# Patient Record
Sex: Female | Born: 1985 | Race: White | Hispanic: No | Marital: Married | Smoking: Never smoker
Health system: Southern US, Community
[De-identification: ages and names within clinical notes are randomized; demographics above are authoritative.]

## PROBLEM LIST (undated history)

## (undated) DIAGNOSIS — G43909 Migraine, unspecified, not intractable, without status migrainosus: Secondary | ICD-10-CM

## (undated) DIAGNOSIS — F32A Depression, unspecified: Secondary | ICD-10-CM

## (undated) DIAGNOSIS — J45909 Unspecified asthma, uncomplicated: Secondary | ICD-10-CM

## (undated) DIAGNOSIS — F419 Anxiety disorder, unspecified: Secondary | ICD-10-CM

## (undated) DIAGNOSIS — F329 Major depressive disorder, single episode, unspecified: Secondary | ICD-10-CM

## (undated) DIAGNOSIS — F1010 Alcohol abuse, uncomplicated: Secondary | ICD-10-CM

## (undated) HISTORY — DX: Alcohol abuse, uncomplicated: F10.10

## (undated) HISTORY — DX: Migraine, unspecified, not intractable, without status migrainosus: G43.909

## (undated) HISTORY — DX: Anxiety disorder, unspecified: F41.9

---
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
## 2005-06-30 ENCOUNTER — Other Ambulatory Visit: Admission: RE | Admit: 2005-06-30 | Discharge: 2005-06-30 | Payer: Self-pay | Admitting: Gynecology

## 2006-10-10 ENCOUNTER — Other Ambulatory Visit: Admission: RE | Admit: 2006-10-10 | Discharge: 2006-10-10 | Payer: Self-pay | Admitting: Gynecology

## 2008-03-13 ENCOUNTER — Other Ambulatory Visit: Admission: RE | Admit: 2008-03-13 | Discharge: 2008-03-13 | Payer: Self-pay | Admitting: Gynecology

## 2008-07-01 ENCOUNTER — Emergency Department (HOSPITAL_COMMUNITY): Admission: EM | Admit: 2008-07-01 | Discharge: 2008-07-01 | Payer: Self-pay | Admitting: Emergency Medicine

## 2009-03-26 ENCOUNTER — Encounter: Payer: Self-pay | Admitting: Women's Health

## 2009-03-26 ENCOUNTER — Other Ambulatory Visit: Admission: RE | Admit: 2009-03-26 | Discharge: 2009-03-26 | Payer: Self-pay | Admitting: Gynecology

## 2009-03-26 ENCOUNTER — Ambulatory Visit: Payer: Self-pay | Admitting: Women's Health

## 2010-06-02 ENCOUNTER — Ambulatory Visit: Payer: Self-pay | Admitting: Gynecology

## 2010-06-02 ENCOUNTER — Other Ambulatory Visit: Admission: RE | Admit: 2010-06-02 | Discharge: 2010-06-02 | Payer: Self-pay | Admitting: Gynecology

## 2010-10-19 LAB — TSH: TSH: 2.55 u[IU]/mL (ref <=5.90)

## 2011-06-06 ENCOUNTER — Encounter: Payer: Self-pay | Admitting: Women's Health

## 2011-06-08 ENCOUNTER — Ambulatory Visit (INDEPENDENT_AMBULATORY_CARE_PROVIDER_SITE_OTHER): Payer: PRIVATE HEALTH INSURANCE | Admitting: Women's Health

## 2011-06-08 ENCOUNTER — Other Ambulatory Visit (HOSPITAL_COMMUNITY)
Admission: RE | Admit: 2011-06-08 | Discharge: 2011-06-08 | Disposition: A | Discharge status: Home / Self Care | Payer: PRIVATE HEALTH INSURANCE | Source: Ambulatory Visit | Attending: Obstetrics and Gynecology | Admitting: Obstetrics and Gynecology

## 2011-06-08 ENCOUNTER — Encounter: Payer: Self-pay | Admitting: Women's Health

## 2011-06-08 VITALS — BP 110/60 | Ht 62.0 in | Wt 131.0 lb

## 2011-06-08 DIAGNOSIS — IMO0001 Reserved for inherently not codable concepts without codable children: Secondary | ICD-10-CM

## 2011-06-08 DIAGNOSIS — Z01419 Encounter for gynecological examination (general) (routine) without abnormal findings: Secondary | ICD-10-CM

## 2011-06-08 DIAGNOSIS — Z309 Encounter for contraceptive management, unspecified: Secondary | ICD-10-CM

## 2011-06-08 MED ORDER — NORGESTIMATE-ETH ESTRADIOL 0.25-35 MG-MCG PO TABS: 1.0000 | ORAL_TABLET | Freq: Every day | ORAL | Status: DC

## 2011-06-08 NOTE — Progress Notes (Signed)
[name] [name] 1986/03/31 [id number]    History:    The patient presents for annual exam.   Working at job she does not enjoy, ride horses which she does enjoy.   Past medical history, past surgical history, family history and social history were all reviewed and documented in the EPIC chart.   ROS:  A  ROS was performed and pertinent positives and negatives are included in the history.  Exam:  Filed Vitals:   06/08/11 1540  BP: 110/60    General appearance:  Normal Head/Neck:  Normal, without cervical or supraclavicular adenopathy. Thyroid:  Symmetrical, normal in size, without palpable masses or nodularity. Respiratory  Effort:  Normal  Auscultation:  Clear without wheezing or rhonchi Cardiovascular  Auscultation:  Regular rate, without rubs, murmurs or gallops  Edema/varicosities:  Not grossly evident Abdominal  Soft,nontender, without masses, guarding or rebound.  Liver/spleen:  No organomegaly noted  Hernia:  None appreciated  Skin  Inspection:  Grossly normal  Palpation:  Grossly normal Neurologic/psychiatric  Orientation:  Normal with appropriate conversation.  Mood/affect:  Normal  Genitourinary    Breasts: Examined lying and sitting.     Right: Without masses, retractions, discharge or axillary adenopathy.     Left: Without masses, retractions, discharge or axillary adenopathy.   Inguinal/mons:  Normal without inguinal adenopathy  External genitalia:  Normal  BUS/Urethra/Skene's glands:  Normal  Bladder:  Normal  Vagina:  Normal  Cervix:  Normal  Uterus:   normal in size, shape and contour.  Midline and mobile  Adnexa/parametria:     Rt: Without masses or tenderness.   Lt: Without masses or tenderness.  Anus and perineum: Normal  Digital rectal exam: Normal sphincter tone without palpated masses or tenderness  Assessment/Plan:  25 y.o. MWF G0 for annual exam. Monthly 5-6 day cycle on Ortho Tri-Cyclen. Has completed the Gardasil series. Has had some  problems with anxiety and depression but declines need for counseling or medication. She is rubella immune. States has had some decreased libido, but great relationship with her husband.  Normal GYN exam with decreased libido  Plan: Did review coming off birth control pills to see if that may help, pregnancy at this time. Will try a monophasic Sprintec, will start after completing her current pack. Did review importance of date nights, and making time for the relationship. SBEs, exercise, MVI daily, calcium rich diet encouraged. Prescription proper use for Sprintec was given with slight risk for blood clots, strokes. CBC, UA and Pap    Harrington Challenger [facility], 5:07 PM 06/08/2011

## 2011-06-13 LAB — POCT URINALYSIS DIP (DEVICE)
Ketones, ur: NEGATIVE
Nitrite: NEGATIVE
Protein, ur: 30 — AB
pH: 7

## 2011-06-13 LAB — POCT PREGNANCY, URINE: Preg Test, Ur: NEGATIVE

## 2011-06-22 ENCOUNTER — Other Ambulatory Visit: Payer: Self-pay | Admitting: Gynecology

## 2011-09-02 ENCOUNTER — Ambulatory Visit (INDEPENDENT_AMBULATORY_CARE_PROVIDER_SITE_OTHER): Payer: PRIVATE HEALTH INSURANCE

## 2011-09-02 DIAGNOSIS — F429 Obsessive-compulsive disorder, unspecified: Secondary | ICD-10-CM

## 2011-10-04 ENCOUNTER — Telehealth: Payer: Self-pay | Admitting: *Deleted

## 2011-10-04 NOTE — Telephone Encounter (Signed)
Pt called and said she is ready to start her family. She is married and wants to know if you would recommend any prenatal vitamins to take? Please advise

## 2011-10-05 NOTE — Telephone Encounter (Signed)
Any over-the-counter prenatal vitamin that has DHA. DHA is supposedly helpful for brain development and therefore smart children. Review no alcohol, avoid over-the-counter medications other than plain Tylenol and Robitussin, if questions about other medications please call. If she has a CAT best to have husband change litter box. Also best to avoid unpasteurized cheeses and tuna with pregnancy. Have her call the office when she misses a cycle to schedule a viability ultrasound. We will give her copy of the ultrasound to take to her first prenatal visit. Review we do not deliver babies but will refer.  Have her call if she has any questions.

## 2011-10-05 NOTE — Telephone Encounter (Signed)
Pt informed with all the below note. She will follow up if questions.

## 2011-10-05 NOTE — Telephone Encounter (Signed)
L/M FOR PT TO CALL.

## 2011-10-17 ENCOUNTER — Encounter: Payer: Self-pay | Admitting: Family Medicine

## 2011-10-19 ENCOUNTER — Ambulatory Visit (INDEPENDENT_AMBULATORY_CARE_PROVIDER_SITE_OTHER): Payer: PRIVATE HEALTH INSURANCE | Admitting: Internal Medicine

## 2011-10-19 ENCOUNTER — Encounter: Payer: Self-pay | Admitting: Internal Medicine

## 2011-10-19 DIAGNOSIS — F411 Generalized anxiety disorder: Secondary | ICD-10-CM | POA: Insufficient documentation

## 2011-10-19 DIAGNOSIS — F429 Obsessive-compulsive disorder, unspecified: Secondary | ICD-10-CM

## 2011-10-19 DIAGNOSIS — F419 Anxiety disorder, unspecified: Secondary | ICD-10-CM

## 2011-10-19 NOTE — Progress Notes (Signed)
  Subjective:    Patient ID: [name] [name], female    DOB: Jan 10, 1986, 26 y.o.   MRN: [id number]  [facility] presents as a followup for her OCD and anxiety. She has had a remarkable two-month period where she has decided to become pregnant. She is 26 years old and is very stable relationship and her husband and is excited about the idea. She has weaned herself off all of her medications and stopped her birth control pill and is taking a prenatal vitamin. Her OB care will be arranged by Maryelizabeth Rowan. She has completely stopped alcohol and as a result her anxiety is much better. She still struggles with excessive thoughts about harm to her parents and other Loved ones but is using breathing techniques and rational thinking to control those for now    Review of Systemsnoncontributory Family history is important in that the mother had significant postpartum depression     Objective:   Physical Examvital signs were stable and no other part of the exam was needed        Assessment & Plan:  Problem #1 OCD  Problem #2 anxiety  She will begin an exercise program and continue the prenatal vitamins. She will get the book OCD for dummies and begin to look at cognitive behavioral therapy in the event that her symptoms become worse. She will followup when necessary but certainly by the time of delivery so that we can plan for the postpartum period

## 2011-11-14 ENCOUNTER — Encounter: Payer: Self-pay | Admitting: Women's Health

## 2011-11-14 ENCOUNTER — Ambulatory Visit (INDEPENDENT_AMBULATORY_CARE_PROVIDER_SITE_OTHER): Payer: PRIVATE HEALTH INSURANCE | Admitting: Women's Health

## 2011-11-14 DIAGNOSIS — N912 Amenorrhea, unspecified: Secondary | ICD-10-CM

## 2011-11-14 LAB — PREGNANCY, URINE: Preg Test, Ur: POSITIVE

## 2011-11-14 NOTE — Patient Instructions (Signed)
Guidelines for Vaccination and Immunization during Pregnancy It is important to be immunized against common and ordinary contagious diseases when you are pregnant. Being immunized will protect yourself and your unborn baby from becoming very ill from these infections. When possible immunizations should be given before getting pregnant. This is especially true if you live in a high risk area for contracting any of the diseases that have a vaccine.  Vaccines produce antibodies that protect you from getting the infection. Risk to a developing fetus from vaccination of the mother during pregnancy is mostly theoretical. No evidence exists of risk from vaccinating pregnant women with inactivated viral or bacterial vaccines or toxoids. Benefits of vaccinating pregnant women usually outweigh risks when:  The likelihood of disease exposure is high.   Infection would pose a risk to the mother or fetus.   The vaccine is unlikely to cause harm.  Live-virus and live bacterial vaccines are not usually given to pregnant women. There is a possible risk of passing the vaccine virus or bacteria to the fetus. If a live-virus or live-bacterial vaccine is accidentally given to a pregnant woman, or if a woman becomes pregnant within 4 weeks after vaccination, she should be counseled about the potential effects on the fetus. It is not usually an indication to end the pregnancy. Whether live or inactivated (killed virus or bacteria) vaccines are used, vaccination of pregnant women should be considered on the basis of risks versus benefits.  ROUTINE VACCINES HEPATITIS A  The safety of hepatitis A vaccination during pregnancy has not been determined. Because hepatitis A vaccine is produced from inactivated hepatitis A virus, the theoretical risk to the fetus is expected to be low. The risk associated with vaccination should be weighed against the risk for hepatitis A in women who may be at high risk for exposure to hepatitis A  virus. There is no known risk from the vaccine affecting the fetus. HEPATITIS B On the basis of limited data, there is no apparent risk of adverse effects to the fetus when hepatitis B vaccine is given to pregnant women. The vaccine contains noninfectious HBsAg particles and should cause no risk to the fetus. Hepatitis B virus infection affecting a pregnant woman may result in:  Severe disease for the mother.   Abortion or chronic infection for the newborn.  Therefore, neither pregnancy nor breastfeeding should stop this vaccination of women. Hepatitis B vaccine is recommended for pregnant women at risk for hepatitis B virus infection. If the mother contracts Hepatitis B during pregnancy, her newborn baby should receive the vaccine and hepatitis B immune globulin.  INFLUENZA (inactivated)  All women who will be pregnant during the influenza season should be vaccinated because of the increased risk for influenza-related complications. Vaccination can occur in any trimester. Pregnant women should only receive inactivated influenza vaccine. Pregnant women who develop influenza and are not vaccinated are at risk for:  Severe disease.   Spontaneous abortion.  MEASLES Measles-mumps-rubella (MMR) vaccine and its component (partial) vaccines should NOT be given to women known to be pregnant. There may be a risk to the fetus from these live virus vaccines. Women should avoid becoming pregnant for 28 days after vaccination with measles or mumps vaccines or MMR or other rubella-containing vaccines. If a pregnant woman is accidentally vaccinated or if she becomes pregnant within 4 weeks after an MMR vaccination, she should be counseled regarding the risk to the fetus. However, the MMR vaccine given during pregnancy should not be a reason to end  the pregnancy.  MUMPS Measles-mumps rubella (MMR) vaccine and its component (partial) vaccines should NOT be given to women known to be pregnant. There may be a risk to  the fetus from injection of these live virus vaccines. Women should be counseled to avoid becoming pregnant for 28 days after vaccination with measles or mumps vaccines or MMR or other rubella-containing vaccines. If a pregnant woman is accidentally vaccinated or if she becomes pregnant within 4 weeks after MMR vaccination, she should be counseled regarding the risk to the fetus. However, MMR vaccination during pregnancy should not ordinarily be a reason to end the pregnancy.  PNEUMOCOCCAL  The safety of pneumococcal polysaccharide vaccine during the first trimester of pregnancy has not been studied. No problems have been reported among newborns whose mothers were vaccinated during pregnancy. POLIO (IPV)  Although no adverse effects of IPV have been documented among pregnant women or their fetuses, vaccination of pregnant women should be avoided to be on the safe side. However, if a pregnant woman is at increased risk for infection and requires immediate protection against polio, IPV can be given according to the recommended schedules for adults.  RUBELLA  Measles-mumps rubella (MMR) vaccine and its component (partial) vaccines should NOT be given to women known to be pregnant. There may be a risk to the fetus from injection of these live virus vaccines. Women should be counseled to avoid becoming pregnant for 28 days after vaccination with:  Measles or mumps vaccine.   MMR.   Other rubella-containing vaccines.  If a pregnant woman is accidentally vaccinated or if she becomes pregnant within 4 weeks after MMR vaccination, she should be counseled regarding the risk to the fetus. However, MMR vaccination during pregnancy should not ordinarily be a reason to terminate the pregnancy. Women who have not had rubella and are not vaccinated because they are or may be pregnant should be counseled about the potential risk for CRS (congenital rubella syndrome). They should also be informed about the importance of  being vaccinated as soon as they are no longer pregnant. TETANUS & DIPHTHERIA  Td toxoid is commonly indicated for pregnant women. Previously vaccinated pregnant women who have not received a Td vaccination within the last 10 years should receive a booster dose. Pregnant women who are not immunized or only partially immunized against tetanus should complete the primary series. Although no evidence exists that tetanus and diphtheria toxoids are harmful to the fetus or embryo, waiting until the second trimester of pregnancy to give Td is a reasonable warning for limiting any concern about the possibility of such reactions.  VARICELLA The effects of the varicella virus vaccine on the fetus are unknown. Pregnant women should NOT be vaccinated with this live virus vaccine. Women who are vaccinated should also avoid becoming pregnant for 1 month following each injection. For people who are susceptible to the virus, having a pregnant household member is not a reason to avoid vaccination. If a pregnant woman is vaccinated by mistake or if she becomes pregnant within 4 weeks after varicella vaccination, she should be counseled regarding the risk to the fetus. However, varicella vaccination during pregnancy should not be a reason to end the pregnancy. VZIG [Varicella Zoster Immune Globulin] should be strongly considered for susceptible pregnant women who have been exposed to the virus. The manufacturer & CDC have established a VARIVAX Pregnancy Registry to monitor outcomes of women who got the vaccine 3 months before or any time during pregnancy.  TRAVEL AND OTHER VACCINES ANTHRAX  No studies have been done about the use of anthrax vaccine among pregnant women. Pregnant women should be vaccinated against anthrax ONLY IF the potential benefits outweigh the potential risks to the fetus.  BCG  BCG is a vaccine that contains live bacteria. Although no harmful effects to the fetus have been associated with BCG vaccine,  its use is NOT recommended during pregnancy. JAPANESE ENCEPHALITIS  This vaccine contains live virus. No specific information is available on the safety of JE vaccine in pregnancy. This vaccination poses an unknown but possible risk to the developing fetus. The vaccine should NOT be routinely given during pregnancy. Pregnant women who must travel to an area where risk of JE is high should be vaccinated when the possible risks of immunization are outweighed by the risk of infection to the mother and developing fetus.  MENINGOCOCCAL  Studies have shown this vaccine to be both safe and effective when given to pregnant women. There are no reports of the vaccine adversely affecting the fetus. If indicated because of travel, vaccination should be offered to pregnant women.  RABIES  Because of the possible danger of untreated exposure to rabies, and because there is no indication that problems with the fetus have been associated with the rabies vaccine (killed virus), it is not necessary for pregnant women to avoid the vaccine if exposed to rabies. If the risk of being exposed to rabies is great, pre-exposure treatment might also be suggested. There is no indication that fetal abnormalities have been associated with rabies vaccination. Pregnancy is not a reason to avoid vaccination against rabies. If the risk of exposure to rabies is substantial, preexposure prophylaxis might also be indicated during pregnancy. TYPHOID There is no data on the use of any of the three typhoid vaccines among pregnant women. It is not recommended except in cases of very high risk of of exposure to typhoid. There are no reports of the vaccine adversely affecting the fetus. VACCINIA (SMALLPOX) This is a live virus vaccine and is NOT recommended during pregnancy. Vaccinia vaccine should NOT be given to pregnant women for non-emergency reasons. However, vaccinia vaccine is not known to cause congenital problems. Although less than 50  cases of fetal vaccinia infection have been reported, vaccinia virus has been reported to cause fetal infection on rare occasions, almost always after the primary vaccination of the mother. Pregnant women who have had a definite exposure to smallpox virus are at high risk for contracting the disease and should be vaccinated. Definite exposure means the pregnant women has had:  Face-to-face.   Household.   Close-proximity contact with a smallpox patient.  Smallpox infection among pregnant women has been reported to result in a more severe infection than among non-pregnant women. The risks to the mother and the fetus from experiencing smallpox outweigh any potential risks from the vaccination. In addition, vaccinia virus has not been proven to cause developmental abnormalities to the fetus or embryo, and the incidence of fetal vaccinia is low. When the level of exposure risk is unknown, the decision to vaccinate should be made after evaluation by the caregiver and discussing with the patient the potential risks versus the benefits of smallpox vaccination. YELLOW FEVER The safety of yellow fever vaccination during pregnancy has not been proven. The vaccine (live virus) should be given only if:  Travel to an area known to be infected is unavoidable   An increased risk for exposure exists.  If international travel are the only reason to vaccinate a pregnant woman,  rather than an increased risk of infection, efforts should be made to obtain a waiver letter from the traveling woman's caregiver. Pregnant women who must travel to areas where the risk of yellow fever is high should be vaccinated. Despite the apparent safety of this vaccine, infants born to these women should be monitored closely for:  Evidence of congenital (existing at birth) infection.   Other possible harmful effects resulting from yellow fever vaccination.  If vaccination of a pregnant woman is necessary, blood tests to document an  immune response to the vaccine can be considered. To discuss the need for blood testing contact:  The appropriate state health department.   The Division of Vector-Borne Infectious Diseases.   The Division of Global Migration and Quarantine at [employer].  If you are pregnant or thinking of becoming pregnant, check with your caregivers regarding current guidelines and recommendations. To be safe, see your caregiver before getting pregnant and get the necessary immunizations.  Most of this information courtesy of the CDC.  Document Released: 09/18/2007 Document Revised: 08/18/2011 Document Reviewed: 05/28/2009 [facility] Patient Information 2012 [place], [place].

## 2011-11-14 NOTE — Progress Notes (Signed)
Patient ID: [name] [name], female   DOB: [birth year], 26 y.o.   MRN: [id number] Presents with a home positive U PT. LMP 10/12/11. Stopped birth control pills in  December. Currently taking only a prenatal vitamin daily. Denies any bleeding, cramping, or change in discharge. Pleased with pregnancy.  U PT positive, 4 weeks 4 days for dates.  Plan: Viability ultrasound after March 18, we'll schedule. Is aware we no longer deliver. We'll seek prenatal care after viability ultrasound. Continue prenatal vitamin daily, reviewed safe behaviors in pregnancy. She is a horse back rider, did caution against riding.

## 2011-11-26 ENCOUNTER — Other Ambulatory Visit: Payer: Self-pay | Admitting: Internal Medicine

## 2011-11-28 ENCOUNTER — Encounter: Payer: Self-pay | Admitting: Women's Health

## 2011-11-28 ENCOUNTER — Other Ambulatory Visit: Payer: Self-pay | Admitting: Gynecology

## 2011-11-28 ENCOUNTER — Ambulatory Visit (INDEPENDENT_AMBULATORY_CARE_PROVIDER_SITE_OTHER): Payer: PRIVATE HEALTH INSURANCE

## 2011-11-28 ENCOUNTER — Ambulatory Visit (INDEPENDENT_AMBULATORY_CARE_PROVIDER_SITE_OTHER): Payer: PRIVATE HEALTH INSURANCE | Admitting: Women's Health

## 2011-11-28 DIAGNOSIS — N912 Amenorrhea, unspecified: Secondary | ICD-10-CM

## 2011-11-28 DIAGNOSIS — O3680X0 Pregnancy with inconclusive fetal viability, not applicable or unspecified: Secondary | ICD-10-CM

## 2011-11-28 LAB — US OB TRANSVAGINAL

## 2011-11-28 NOTE — Progress Notes (Signed)
Patient ID: NYCHELLE CASSATA, female   DOB: [birth year], 26 y.o.   MRN: [id number] Presents for a viability ultrasound. Denies any bleeding or cramping or problems.  Ultrasound: Anteverted uterus with living IUP seen in fundus. Size equal dates  by LMP. Fetal pole and fetal heart motion seen. Right ovary corpus luteum cyst. Left ovary normal. Negative cul-de-sac. Cervix long and closed.  Plan: A copy of ultrasound was given to take to first prenatal office visit. Continue prenatal vitamins daily. Is aware of safe pregnancy behaviors. Congratulations given.

## 2011-12-08 LAB — OB RESULTS CONSOLE GC/CHLAMYDIA
Chlamydia: NEGATIVE
Gonorrhea: NEGATIVE

## 2011-12-08 LAB — OB RESULTS CONSOLE ANTIBODY SCREEN: Antibody Screen: NEGATIVE

## 2011-12-08 LAB — OB RESULTS CONSOLE ABO/RH: RH Type: POSITIVE

## 2011-12-08 LAB — OB RESULTS CONSOLE RUBELLA ANTIBODY, IGM: Rubella: IMMUNE

## 2012-07-23 ENCOUNTER — Inpatient Hospital Stay (HOSPITAL_COMMUNITY)
Admission: AD | Admit: 2012-07-23 | Discharge: 2012-07-27 | DRG: 371 | Disposition: A | Discharge status: Home / Self Care | Payer: BC Managed Care – PPO | Source: Ambulatory Visit | Attending: Obstetrics and Gynecology | Admitting: Obstetrics and Gynecology

## 2012-07-23 ENCOUNTER — Encounter (HOSPITAL_COMMUNITY): Payer: Self-pay | Admitting: *Deleted

## 2012-07-23 DIAGNOSIS — O324XX0 Maternal care for high head at term, not applicable or unspecified: Secondary | ICD-10-CM | POA: Diagnosis present

## 2012-07-23 DIAGNOSIS — F419 Anxiety disorder, unspecified: Secondary | ICD-10-CM

## 2012-07-23 DIAGNOSIS — F429 Obsessive-compulsive disorder, unspecified: Secondary | ICD-10-CM

## 2012-07-23 HISTORY — DX: Major depressive disorder, single episode, unspecified: F32.9

## 2012-07-23 HISTORY — DX: Unspecified asthma, uncomplicated: J45.909

## 2012-07-23 HISTORY — DX: Depression, unspecified: F32.A

## 2012-07-23 LAB — CBC
Platelets: 170 10*3/uL (ref 150–400)
RBC: 4.14 MIL/uL (ref 3.87–5.11)
RDW: 13.4 % (ref 11.5–15.5)
WBC: 13.4 10*3/uL — ABNORMAL HIGH (ref 4.0–10.5)

## 2012-07-23 MED ORDER — ZOLPIDEM TARTRATE 5 MG PO TABS
5.0000 mg | ORAL_TABLET | Freq: Every evening | ORAL | Status: DC | PRN
  Administered 2012-07-23: 5 mg via ORAL
  Filled 2012-07-23: qty 1

## 2012-07-23 MED ORDER — LACTATED RINGERS IV SOLN: 500.0000 mL | INTRAVENOUS | Status: DC | PRN

## 2012-07-23 MED ORDER — OXYTOCIN 40 UNITS IN LACTATED RINGERS INFUSION - SIMPLE MED: 62.5000 mL/h | INTRAVENOUS | Status: DC

## 2012-07-23 MED ORDER — MISOPROSTOL 25 MCG QUARTER TABLET: 25.0000 ug | ORAL_TABLET | ORAL | Status: DC | PRN

## 2012-07-23 MED ORDER — TERBUTALINE SULFATE 1 MG/ML IJ SOLN: 0.2500 mg | Freq: Once | INTRAMUSCULAR | Status: AC | PRN

## 2012-07-23 MED ORDER — ONDANSETRON HCL 4 MG/2ML IJ SOLN
4.0000 mg | Freq: Four times a day (QID) | INTRAMUSCULAR | Status: DC | PRN
  Administered 2012-07-24: 4 mg via INTRAVENOUS
  Filled 2012-07-23: qty 2

## 2012-07-23 MED ORDER — ACETAMINOPHEN 325 MG PO TABS: 650.0000 mg | ORAL_TABLET | ORAL | Status: DC | PRN

## 2012-07-23 MED ORDER — IBUPROFEN 600 MG PO TABS: 600.0000 mg | ORAL_TABLET | Freq: Four times a day (QID) | ORAL | Status: DC | PRN

## 2012-07-23 MED ORDER — LIDOCAINE HCL (PF) 1 % IJ SOLN
30.0000 mL | INTRAMUSCULAR | Status: DC | PRN
  Filled 2012-07-23: qty 30

## 2012-07-23 MED ORDER — CITRIC ACID-SODIUM CITRATE 334-500 MG/5ML PO SOLN
30.0000 mL | ORAL | Status: DC | PRN
  Administered 2012-07-24: 30 mL via ORAL
  Filled 2012-07-23: qty 15

## 2012-07-23 MED ORDER — OXYTOCIN 40 UNITS IN LACTATED RINGERS INFUSION - SIMPLE MED
1.0000 m[IU]/min | INTRAVENOUS | Status: DC
  Administered 2012-07-23: 2 m[IU]/min via INTRAVENOUS
  Filled 2012-07-23: qty 1000

## 2012-07-23 MED ORDER — OXYTOCIN BOLUS FROM INFUSION: 500.0000 mL | INTRAVENOUS | Status: DC

## 2012-07-23 MED ORDER — LACTATED RINGERS IV SOLN
INTRAVENOUS | Status: DC
  Administered 2012-07-24 (×2): via INTRAVENOUS

## 2012-07-23 MED ORDER — OXYCODONE-ACETAMINOPHEN 5-325 MG PO TABS: 1.0000 | ORAL_TABLET | ORAL | Status: DC | PRN

## 2012-07-23 MED ORDER — OXYTOCIN 40 UNITS IN LACTATED RINGERS INFUSION - SIMPLE MED: 1.0000 m[IU]/min | INTRAVENOUS | Status: DC

## 2012-07-23 NOTE — Progress Notes (Signed)
Notified Dr. Renaldo Fiddler concerning uterine activity and placement of Cytotec. Orders received to begin Pitocin infusion per protocol

## 2012-07-24 ENCOUNTER — Encounter (HOSPITAL_COMMUNITY): Payer: Self-pay | Admitting: *Deleted

## 2012-07-24 ENCOUNTER — Encounter (HOSPITAL_COMMUNITY): Payer: Self-pay | Admitting: Anesthesiology

## 2012-07-24 LAB — COMPREHENSIVE METABOLIC PANEL
ALT: 11 U/L (ref 0–35)
Albumin: 2.4 g/dL — ABNORMAL LOW (ref 3.5–5.2)
Alkaline Phosphatase: 142 U/L — ABNORMAL HIGH (ref 39–117)
BUN: 8 mg/dL (ref 6–23)
Chloride: 102 mEq/L (ref 96–112)
Potassium: 3.9 mEq/L (ref 3.5–5.1)
Sodium: 133 mEq/L — ABNORMAL LOW (ref 135–145)
Total Bilirubin: 0.4 mg/dL (ref 0.3–1.2)
Total Protein: 5.7 g/dL — ABNORMAL LOW (ref 6.0–8.3)

## 2012-07-24 LAB — URIC ACID: Uric Acid, Serum: 6.2 mg/dL (ref 2.4–7.0)

## 2012-07-24 LAB — CBC
HCT: 35.5 % — ABNORMAL LOW (ref 36.0–46.0)
Hemoglobin: 12 g/dL (ref 12.0–15.0)
RDW: 13.3 % (ref 11.5–15.5)
WBC: 14.2 10*3/uL — ABNORMAL HIGH (ref 4.0–10.5)

## 2012-07-24 LAB — RPR: RPR Ser Ql: NONREACTIVE

## 2012-07-24 MED ORDER — EPHEDRINE 5 MG/ML INJ: 10.0000 mg | INTRAVENOUS | Status: DC | PRN

## 2012-07-24 MED ORDER — OXYTOCIN 40 UNITS IN LACTATED RINGERS INFUSION - SIMPLE MED: 1.0000 m[IU]/min | INTRAVENOUS | Status: DC

## 2012-07-24 MED ORDER — FENTANYL 2.5 MCG/ML BUPIVACAINE 1/10 % EPIDURAL INFUSION (WH - ANES)
INTRAMUSCULAR | Status: DC | PRN
  Administered 2012-07-24: 14 mL/h via EPIDURAL

## 2012-07-24 MED ORDER — LACTATED RINGERS IV SOLN: 500.0000 mL | Freq: Once | INTRAVENOUS | Status: DC

## 2012-07-24 MED ORDER — FENTANYL 2.5 MCG/ML BUPIVACAINE 1/10 % EPIDURAL INFUSION (WH - ANES)
14.0000 mL/h | INTRAMUSCULAR | Status: DC
Start: 2012-07-24 — End: 2012-07-25
  Administered 2012-07-24: 14 mL/h via EPIDURAL
  Filled 2012-07-24 (×2): qty 125

## 2012-07-24 MED ORDER — DIPHENHYDRAMINE HCL 50 MG/ML IJ SOLN: 12.5000 mg | INTRAMUSCULAR | Status: DC | PRN

## 2012-07-24 MED ORDER — PHENYLEPHRINE 40 MCG/ML (10ML) SYRINGE FOR IV PUSH (FOR BLOOD PRESSURE SUPPORT)
80.0000 ug | PREFILLED_SYRINGE | INTRAVENOUS | Status: DC | PRN
  Filled 2012-07-24: qty 5

## 2012-07-24 MED ORDER — BUTORPHANOL TARTRATE 1 MG/ML IJ SOLN: 1.0000 mg | INTRAMUSCULAR | Status: DC | PRN

## 2012-07-24 MED ORDER — BUTORPHANOL TARTRATE 1 MG/ML IJ SOLN
INTRAMUSCULAR | Status: AC
  Administered 2012-07-24: 1 mg
  Filled 2012-07-24: qty 1

## 2012-07-24 MED ORDER — EPHEDRINE 5 MG/ML INJ
10.0000 mg | INTRAVENOUS | Status: DC | PRN
  Filled 2012-07-24: qty 4

## 2012-07-24 MED ORDER — PHENYLEPHRINE 40 MCG/ML (10ML) SYRINGE FOR IV PUSH (FOR BLOOD PRESSURE SUPPORT): 80.0000 ug | PREFILLED_SYRINGE | INTRAVENOUS | Status: DC | PRN

## 2012-07-24 MED ORDER — LIDOCAINE HCL (PF) 1 % IJ SOLN
INTRAMUSCULAR | Status: DC | PRN
  Administered 2012-07-24 (×2): 9 mL

## 2012-07-24 NOTE — Progress Notes (Signed)
Pushing longer than 2.5 hours with good effort vtx at 0 to +1, OP FHT reactive  A: Arrest of Descent  P: Rec: C/S     D/W patient and husband risks-infection, organ damage, bleeding/transfusion-HIV/Hep, DVT/PE, pneumonia. All questions answered.

## 2012-07-24 NOTE — Anesthesia Procedure Notes (Signed)
Epidural Patient location during procedure: OB Start time: 07/24/2012 11:33 AM End time: 07/24/2012 11:37 AM  Staffing Anesthesiologist: Sandrea Hughs Performed by: anesthesiologist   Preanesthetic Checklist Completed: patient identified, site marked, surgical consent, pre-op evaluation, timeout performed, IV checked, risks and benefits discussed and monitors and equipment checked  Epidural Patient position: sitting Prep: site prepped and draped and DuraPrep Patient monitoring: continuous pulse ox and blood pressure Approach: midline Injection technique: LOR air  Needle:  Needle type: Tuohy  Needle gauge: 17 G Needle length: 9 cm and 9 Needle insertion depth: 5 cm cm Catheter type: closed end flexible Catheter size: 19 Gauge Catheter at skin depth: 10 cm Test dose: negative and Other  Assessment Sensory level: T8 Events: blood not aspirated, injection not painful, no injection resistance, negative IV test and no paresthesia  Additional Notes Reason for block:procedure for pain

## 2012-07-24 NOTE — Progress Notes (Signed)
FHT reactive UCs q2-2.5 min, about 50mm Cx 4/C/-2 IUPC replaced Will check cx in about 1 hour

## 2012-07-24 NOTE — Progress Notes (Signed)
RN called MD to bedside to assess cervix. RN felt cervix on right side while pushing. Asked MD to assess.

## 2012-07-24 NOTE — Progress Notes (Signed)
Cx rim/C/-1 FHT reactive

## 2012-07-24 NOTE — H&P (Signed)
[name] [name] is a 26 y.o. female presenting for induction at 82 6/7 weeks. Patient was contracting too frequently last pm to allow Cytotec placement.  Therefore, pitocin was started. This am, C/O heartburn. No HA/vision change.                                                                                                                                                                                                                              Maternal Medical History:  Fetal activity: Perceived fetal activity is normal.      OB History    Grav Para Term Preterm Abortions TAB SAB Ect Mult Living   1 0             Past Medical History  Diagnosis Date  . Anxiety   . Depression   . Asthma     as a child-no inhaler   History reviewed. No pertinent past surgical history. Family History: family history includes Cancer in her maternal grandmother; Depression in her father; Diabetes in her paternal grandmother; and Hypertension in her father. Social History:  reports that she has never smoked. She has never used smokeless tobacco. She reports that she drinks alcohol. She reports that she does not use illicit drugs.   Prenatal Transfer Tool  Maternal Diabetes: No Genetic Screening: Normal Maternal Ultrasounds/Referrals: Normal Fetal Ultrasounds or other Referrals:  None Maternal Substance Abuse:  No Significant Maternal Medications:  None Significant Maternal Lab Results:  None Other Comments:  None  Review of Systems  Gastrointestinal: Positive for heartburn.    Dilation: 1 Effacement (%): 60 Station: -3 Exam by:: Ulla Mckiernan Blood pressure 124/71, pulse 63, temperature 98.3 F (36.8 C), temperature source Oral, resp. rate 20, height 5\' 2"  (1.575 m), weight 176 lb (79.833 kg), last menstrual period 10/12/2011. Maternal Exam:  Uterine Assessment: Contraction strength is moderate.  Contraction frequency is regular.      Fetal Exam Fetal Monitor Review: Pattern:  accelerations present.       Physical Exam  Cardiovascular: Normal rate and regular rhythm.   Respiratory: Effort normal and breath sounds normal.  GI: She exhibits distension.       Mild epigastric tenderness  Neurological: She has normal reflexes.   vertex by exam UCs q2-3 min FHT reactive Prenatal labs: ABO, Rh: O/Positive/-- (03/28 0000) Antibody: Negative (03/28 0000) Rubella: Immune (03/28 0000) RPR: NON REACTIVE (11/11 2115)  HBsAg: Negative (03/28 0000)  HIV: Non-reactive (03/28 0000)  GBS: Negative (10/10 0000)   Assessment/Plan: 26 yo G1P0 at 40 6/7 weeks for induciton.  D/W patient / husband induction process. Will continue pitocin.  Marvon Shillingburg II,Mariany Mackintosh E 07/24/2012, 8:08 AM

## 2012-07-24 NOTE — Progress Notes (Signed)
Epidural in Cx 4/C/-2 IUPC placed UCs q2-4 min

## 2012-07-24 NOTE — Progress Notes (Signed)
MD at bedside to do SVE. MD determined baby is OP. Ordered to keep pushing

## 2012-07-24 NOTE — Progress Notes (Signed)
FHT reactive, good response to scalp stim Cx 5 ( 6 with UC )/ C/ -1/ + caput

## 2012-07-24 NOTE — Progress Notes (Signed)
Cx small ant rim/+1 station/? Posterior?                                               FHT reactive Begin second stage

## 2012-07-24 NOTE — Anesthesia Preprocedure Evaluation (Signed)
Anesthesia Evaluation  Patient identified by MRN, date of birth, ID band Patient awake    Reviewed: Allergy & Precautions, H&P , NPO status , Patient's Chart, lab work & pertinent test results  Airway Mallampati: II TM Distance: >3 FB Neck ROM: full    Dental No notable dental hx.    Pulmonary    Pulmonary exam normal       Cardiovascular negative cardio ROS      Neuro/Psych negative neurological ROS     GI/Hepatic negative GI ROS, Neg liver ROS,   Endo/Other  negative endocrine ROS  Renal/GU negative Renal ROS  negative genitourinary   Musculoskeletal negative musculoskeletal ROS (+)   Abdominal Normal abdominal exam  (+)   Peds negative pediatric ROS (+)  Hematology negative hematology ROS (+)   Anesthesia Other Findings   Reproductive/Obstetrics (+) Pregnancy                           Anesthesia Physical Anesthesia Plan  ASA: II  Anesthesia Plan: Epidural   Post-op Pain Management:    Induction:   Airway Management Planned:   Additional Equipment:   Intra-op Plan:   Post-operative Plan:   Informed Consent: I have reviewed the patients History and Physical, chart, labs and discussed the procedure including the risks, benefits and alternatives for the proposed anesthesia with the patient or authorized representative who has indicated his/her understanding and acceptance.     Plan Discussed with:   Anesthesia Plan Comments:         Anesthesia Quick Evaluation  

## 2012-07-25 ENCOUNTER — Encounter (HOSPITAL_COMMUNITY): Payer: Self-pay | Admitting: *Deleted

## 2012-07-25 ENCOUNTER — Encounter (HOSPITAL_COMMUNITY): Payer: Self-pay | Admitting: Anesthesiology

## 2012-07-25 ENCOUNTER — Encounter (HOSPITAL_COMMUNITY)
Admission: AD | Disposition: A | Discharge status: Home / Self Care | Payer: Self-pay | Source: Ambulatory Visit | Attending: Obstetrics and Gynecology

## 2012-07-25 ENCOUNTER — Inpatient Hospital Stay (HOSPITAL_COMMUNITY): Payer: BC Managed Care – PPO | Admitting: Anesthesiology

## 2012-07-25 LAB — CBC
HCT: 28.1 % — ABNORMAL LOW (ref 36.0–46.0)
Hemoglobin: 9.5 g/dL — ABNORMAL LOW (ref 12.0–15.0)
MCV: 93.4 fL (ref 78.0–100.0)
WBC: 21.8 10*3/uL — ABNORMAL HIGH (ref 4.0–10.5)

## 2012-07-25 SURGERY — Surgical Case
Anesthesia: Epidural

## 2012-07-25 MED ORDER — PHENYLEPHRINE HCL 10 MG/ML IJ SOLN
INTRAMUSCULAR | Status: DC | PRN
  Administered 2012-07-25: 40 ug via INTRAVENOUS
  Administered 2012-07-25: 80 ug via INTRAVENOUS

## 2012-07-25 MED ORDER — SODIUM BICARBONATE 8.4 % IV SOLN
INTRAVENOUS | Status: AC
  Filled 2012-07-25: qty 50

## 2012-07-25 MED ORDER — SIMETHICONE 80 MG PO CHEW
80.0000 mg | CHEWABLE_TABLET | Freq: Three times a day (TID) | ORAL | Status: DC
  Administered 2012-07-25 – 2012-07-27 (×7): 80 mg via ORAL

## 2012-07-25 MED ORDER — MIDAZOLAM HCL 5 MG/5ML IJ SOLN
INTRAMUSCULAR | Status: DC | PRN
  Administered 2012-07-25: 1 mg via INTRAVENOUS

## 2012-07-25 MED ORDER — LACTATED RINGERS IV SOLN
INTRAVENOUS | Status: DC
  Administered 2012-07-25: 13:00:00 via INTRAVENOUS
  Administered 2012-07-25: 1000 mL via INTRAVENOUS

## 2012-07-25 MED ORDER — IBUPROFEN 600 MG PO TABS
600.0000 mg | ORAL_TABLET | Freq: Four times a day (QID) | ORAL | Status: DC | PRN
  Filled 2012-07-25 (×6): qty 1

## 2012-07-25 MED ORDER — CEFAZOLIN SODIUM-DEXTROSE 2-3 GM-% IV SOLR
INTRAVENOUS | Status: AC
Start: 2012-07-25 — End: 2012-07-25
  Filled 2012-07-25: qty 50

## 2012-07-25 MED ORDER — KETOROLAC TROMETHAMINE 30 MG/ML IJ SOLN: 30.0000 mg | Freq: Four times a day (QID) | INTRAMUSCULAR | Status: AC | PRN

## 2012-07-25 MED ORDER — INFLUENZA VIRUS VACC SPLIT PF IM SUSP: 0.5000 mL | INTRAMUSCULAR | Status: DC

## 2012-07-25 MED ORDER — SODIUM CHLORIDE 0.9 % IV SOLN: 1.0000 ug/kg/h | INTRAVENOUS | Status: DC | PRN

## 2012-07-25 MED ORDER — CEFAZOLIN SODIUM 1-5 GM-% IV SOLN
1.0000 g | Freq: Four times a day (QID) | INTRAVENOUS | Status: AC
  Administered 2012-07-25: 1 g via INTRAVENOUS
  Filled 2012-07-25: qty 50

## 2012-07-25 MED ORDER — LANOLIN HYDROUS EX OINT: 1.0000 "application " | TOPICAL_OINTMENT | CUTANEOUS | Status: DC | PRN

## 2012-07-25 MED ORDER — ZOLPIDEM TARTRATE 5 MG PO TABS: 5.0000 mg | ORAL_TABLET | Freq: Every evening | ORAL | Status: DC | PRN

## 2012-07-25 MED ORDER — WITCH HAZEL-GLYCERIN EX PADS: 1.0000 "application " | MEDICATED_PAD | CUTANEOUS | Status: DC | PRN

## 2012-07-25 MED ORDER — OXYTOCIN 10 UNIT/ML IJ SOLN
INTRAMUSCULAR | Status: AC
  Filled 2012-07-25: qty 4

## 2012-07-25 MED ORDER — SODIUM CHLORIDE 0.9 % IJ SOLN
3.0000 mL | INTRAMUSCULAR | Status: DC | PRN
  Administered 2012-07-25: 3 mL via INTRAVENOUS

## 2012-07-25 MED ORDER — ONDANSETRON HCL 4 MG/2ML IJ SOLN
INTRAMUSCULAR | Status: DC | PRN
  Administered 2012-07-25: 4 mg via INTRAVENOUS

## 2012-07-25 MED ORDER — TETANUS-DIPHTH-ACELL PERTUSSIS 5-2.5-18.5 LF-MCG/0.5 IM SUSP: 0.5000 mL | Freq: Once | INTRAMUSCULAR | Status: DC

## 2012-07-25 MED ORDER — NALBUPHINE HCL 10 MG/ML IJ SOLN
5.0000 mg | INTRAMUSCULAR | Status: DC | PRN
  Filled 2012-07-25: qty 1

## 2012-07-25 MED ORDER — MIDAZOLAM HCL 2 MG/2ML IJ SOLN
INTRAMUSCULAR | Status: AC
  Filled 2012-07-25: qty 2

## 2012-07-25 MED ORDER — MEPERIDINE HCL 25 MG/ML IJ SOLN
INTRAMUSCULAR | Status: DC | PRN
  Administered 2012-07-25 (×3): 6 mg via INTRAVENOUS
  Administered 2012-07-25: 1 mg via INTRAVENOUS
  Administered 2012-07-25: 6 mg via INTRAVENOUS

## 2012-07-25 MED ORDER — CEFAZOLIN SODIUM-DEXTROSE 2-3 GM-% IV SOLR: 2.0000 g | INTRAVENOUS | Status: DC

## 2012-07-25 MED ORDER — ONDANSETRON HCL 4 MG/2ML IJ SOLN: 4.0000 mg | Freq: Three times a day (TID) | INTRAMUSCULAR | Status: DC | PRN

## 2012-07-25 MED ORDER — LACTATED RINGERS IV SOLN
INTRAVENOUS | Status: DC | PRN
  Administered 2012-07-25: 01:00:00 via INTRAVENOUS

## 2012-07-25 MED ORDER — OXYTOCIN 40 UNITS IN LACTATED RINGERS INFUSION - SIMPLE MED: 62.5000 mL/h | INTRAVENOUS | Status: AC

## 2012-07-25 MED ORDER — MENTHOL 3 MG MT LOZG: 1.0000 | LOZENGE | OROMUCOSAL | Status: DC | PRN

## 2012-07-25 MED ORDER — DIPHENHYDRAMINE HCL 50 MG/ML IJ SOLN: 25.0000 mg | INTRAMUSCULAR | Status: DC | PRN

## 2012-07-25 MED ORDER — MEPERIDINE HCL 25 MG/ML IJ SOLN
INTRAMUSCULAR | Status: AC
  Filled 2012-07-25: qty 1

## 2012-07-25 MED ORDER — DIBUCAINE 1 % RE OINT: 1.0000 "application " | TOPICAL_OINTMENT | RECTAL | Status: DC | PRN

## 2012-07-25 MED ORDER — LACTATED RINGERS IV SOLN
INTRAVENOUS | Status: DC | PRN
  Administered 2012-07-25 (×2): via INTRAVENOUS

## 2012-07-25 MED ORDER — KETOROLAC TROMETHAMINE 60 MG/2ML IM SOLN
60.0000 mg | Freq: Once | INTRAMUSCULAR | Status: AC | PRN
  Administered 2012-07-25: 60 mg via INTRAMUSCULAR

## 2012-07-25 MED ORDER — SODIUM BICARBONATE 8.4 % IV SOLN
INTRAVENOUS | Status: DC | PRN
  Administered 2012-07-24: 5 mL via EPIDURAL

## 2012-07-25 MED ORDER — KETOROLAC TROMETHAMINE 60 MG/2ML IM SOLN
INTRAMUSCULAR | Status: AC
  Filled 2012-07-25: qty 2

## 2012-07-25 MED ORDER — ONDANSETRON HCL 4 MG/2ML IJ SOLN: 4.0000 mg | INTRAMUSCULAR | Status: DC | PRN

## 2012-07-25 MED ORDER — MEPERIDINE HCL 25 MG/ML IJ SOLN: 6.2500 mg | INTRAMUSCULAR | Status: DC | PRN

## 2012-07-25 MED ORDER — DIPHENHYDRAMINE HCL 50 MG/ML IJ SOLN: 12.5000 mg | INTRAMUSCULAR | Status: DC | PRN

## 2012-07-25 MED ORDER — SIMETHICONE 80 MG PO CHEW: 80.0000 mg | CHEWABLE_TABLET | ORAL | Status: DC | PRN

## 2012-07-25 MED ORDER — DIPHENHYDRAMINE HCL 25 MG PO CAPS: 25.0000 mg | ORAL_CAPSULE | Freq: Four times a day (QID) | ORAL | Status: DC | PRN

## 2012-07-25 MED ORDER — PRENATAL MULTIVITAMIN CH
1.0000 | ORAL_TABLET | Freq: Every day | ORAL | Status: DC
  Administered 2012-07-25 – 2012-07-27 (×3): 1 via ORAL
  Filled 2012-07-25 (×3): qty 1

## 2012-07-25 MED ORDER — ONDANSETRON HCL 4 MG PO TABS: 4.0000 mg | ORAL_TABLET | ORAL | Status: DC | PRN

## 2012-07-25 MED ORDER — NALOXONE HCL 0.4 MG/ML IJ SOLN: 0.4000 mg | INTRAMUSCULAR | Status: DC | PRN

## 2012-07-25 MED ORDER — MORPHINE SULFATE (PF) 0.5 MG/ML IJ SOLN
INTRAMUSCULAR | Status: DC | PRN
  Administered 2012-07-25: 4 mg via EPIDURAL

## 2012-07-25 MED ORDER — SENNOSIDES-DOCUSATE SODIUM 8.6-50 MG PO TABS
2.0000 | ORAL_TABLET | Freq: Every day | ORAL | Status: DC
  Administered 2012-07-26: 2 via ORAL

## 2012-07-25 MED ORDER — METOCLOPRAMIDE HCL 5 MG/ML IJ SOLN: 10.0000 mg | Freq: Three times a day (TID) | INTRAMUSCULAR | Status: DC | PRN

## 2012-07-25 MED ORDER — HYDROMORPHONE HCL PF 1 MG/ML IJ SOLN
0.2500 mg | INTRAMUSCULAR | Status: DC | PRN
  Administered 2012-07-25 (×2): 0.5 mg via INTRAVENOUS

## 2012-07-25 MED ORDER — OXYCODONE-ACETAMINOPHEN 5-325 MG PO TABS
1.0000 | ORAL_TABLET | ORAL | Status: DC | PRN
  Administered 2012-07-26: 1 via ORAL
  Filled 2012-07-25: qty 1

## 2012-07-25 MED ORDER — DIPHENHYDRAMINE HCL 25 MG PO CAPS
25.0000 mg | ORAL_CAPSULE | ORAL | Status: DC | PRN
  Administered 2012-07-25: 25 mg via ORAL
  Filled 2012-07-25: qty 1

## 2012-07-25 MED ORDER — SCOPOLAMINE 1 MG/3DAYS TD PT72
MEDICATED_PATCH | TRANSDERMAL | Status: AC
  Filled 2012-07-25: qty 1

## 2012-07-25 MED ORDER — IBUPROFEN 600 MG PO TABS
600.0000 mg | ORAL_TABLET | Freq: Four times a day (QID) | ORAL | Status: DC
  Administered 2012-07-25 – 2012-07-27 (×9): 600 mg via ORAL
  Filled 2012-07-25 (×3): qty 1

## 2012-07-25 MED ORDER — LIDOCAINE-EPINEPHRINE (PF) 2 %-1:200000 IJ SOLN
INTRAMUSCULAR | Status: AC
  Filled 2012-07-25: qty 20

## 2012-07-25 MED ORDER — HYDROMORPHONE HCL PF 1 MG/ML IJ SOLN
INTRAMUSCULAR | Status: AC
  Filled 2012-07-25: qty 1

## 2012-07-25 MED ORDER — SCOPOLAMINE 1 MG/3DAYS TD PT72
1.0000 | MEDICATED_PATCH | Freq: Once | TRANSDERMAL | Status: DC
  Administered 2012-07-25: 1.5 mg via TRANSDERMAL

## 2012-07-25 MED ORDER — CEFAZOLIN SODIUM-DEXTROSE 2-3 GM-% IV SOLR
INTRAVENOUS | Status: DC | PRN
  Administered 2012-07-25: 2 g via INTRAVENOUS

## 2012-07-25 MED ORDER — PHENYLEPHRINE 40 MCG/ML (10ML) SYRINGE FOR IV PUSH (FOR BLOOD PRESSURE SUPPORT)
PREFILLED_SYRINGE | INTRAVENOUS | Status: AC
Start: 2012-07-25 — End: 2012-07-25
  Filled 2012-07-25: qty 5

## 2012-07-25 MED ORDER — MORPHINE SULFATE 0.5 MG/ML IJ SOLN
INTRAMUSCULAR | Status: AC
  Filled 2012-07-25: qty 10

## 2012-07-25 MED ORDER — OXYTOCIN 10 UNIT/ML IJ SOLN
40.0000 [IU] | INTRAVENOUS | Status: DC | PRN
  Administered 2012-07-25: 40 [IU] via INTRAVENOUS

## 2012-07-25 MED ORDER — CLONAZEPAM 0.5 MG PO TABS
0.2500 mg | ORAL_TABLET | Freq: Three times a day (TID) | ORAL | Status: DC | PRN
Start: 2012-07-25 — End: 2012-07-27

## 2012-07-25 MED ORDER — MORPHINE SULFATE (PF) 0.5 MG/ML IJ SOLN
INTRAMUSCULAR | Status: DC | PRN
  Administered 2012-07-25: 1 mg via INTRAVENOUS

## 2012-07-25 MED ORDER — ONDANSETRON HCL 4 MG/2ML IJ SOLN
INTRAMUSCULAR | Status: AC
  Filled 2012-07-25: qty 2

## 2012-07-25 SURGICAL SUPPLY — 32 items
BENZOIN TINCTURE PRP APPL 2/3 (GAUZE/BANDAGES/DRESSINGS) IMPLANT
CLOTH BEACON ORANGE TIMEOUT ST (SAFETY) ×2 IMPLANT
CONTAINER PREFILL 10% NBF 15ML (MISCELLANEOUS) IMPLANT
DERMABOND ADVANCED (GAUZE/BANDAGES/DRESSINGS)
DERMABOND ADVANCED .7 DNX12 (GAUZE/BANDAGES/DRESSINGS) IMPLANT
DRAPE SURG 17X23 STRL (DRAPES) ×2 IMPLANT
DRESSING TELFA 8X3 (GAUZE/BANDAGES/DRESSINGS) IMPLANT
DRSG COVADERM 4X10 (GAUZE/BANDAGES/DRESSINGS) ×2 IMPLANT
DURAPREP 26ML APPLICATOR (WOUND CARE) ×2 IMPLANT
ELECT REM PT RETURN 9FT ADLT (ELECTROSURGICAL) ×2
ELECTRODE REM PT RTRN 9FT ADLT (ELECTROSURGICAL) ×1 IMPLANT
EXTRACTOR VACUUM M CUP 4 TUBE (SUCTIONS) IMPLANT
GAUZE SPONGE 4X4 12PLY STRL LF (GAUZE/BANDAGES/DRESSINGS) ×4 IMPLANT
GLOVE BIO SURGEON STRL SZ8 (GLOVE) ×4 IMPLANT
GOWN PREVENTION PLUS LG XLONG (DISPOSABLE) ×2 IMPLANT
KIT ABG SYR 3ML LUER SLIP (SYRINGE) ×2 IMPLANT
NEEDLE HYPO 25X5/8 SAFETYGLIDE (NEEDLE) ×2 IMPLANT
NS IRRIG 1000ML POUR BTL (IV SOLUTION) ×2 IMPLANT
PACK C SECTION WH (CUSTOM PROCEDURE TRAY) ×2 IMPLANT
PAD ABD 7.5X8 STRL (GAUZE/BANDAGES/DRESSINGS) IMPLANT
PAD OB MATERNITY 4.3X12.25 (PERSONAL CARE ITEMS) IMPLANT
SLEEVE SCD COMPRESS KNEE MED (MISCELLANEOUS) IMPLANT
STAPLER VISISTAT 35W (STAPLE) IMPLANT
STRIP CLOSURE SKIN 1/2X4 (GAUZE/BANDAGES/DRESSINGS) IMPLANT
SUT MNCRL 0 VIOLET CTX 36 (SUTURE) ×4 IMPLANT
SUT MONOCRYL 0 CTX 36 (SUTURE) ×4
SUT PDS AB 0 CTX 60 (SUTURE) ×2 IMPLANT
SUT PLAIN 0 NONE (SUTURE) IMPLANT
SUT VIC AB 4-0 KS 27 (SUTURE) ×2 IMPLANT
TOWEL OR 17X24 6PK STRL BLUE (TOWEL DISPOSABLE) ×4 IMPLANT
TRAY FOLEY CATH 14FR (SET/KITS/TRAYS/PACK) ×2 IMPLANT
WATER STERILE IRR 1000ML POUR (IV SOLUTION) ×2 IMPLANT

## 2012-07-25 NOTE — Anesthesia Postprocedure Evaluation (Signed)
  Anesthesia Post-op Note  Patient: [name] [name]  Procedure(s) Performed: Procedure(s) (LRB) with comments: CESAREAN SECTION (N/A)  Patient Location: PACU and Mother/Baby  Anesthesia Type:Epidural  Level of Consciousness: awake, alert  and oriented  Airway and Oxygen Therapy: Patient Spontanous Breathing  Post-op Pain: none  Post-op Assessment: Post-op Vital signs reviewed  Post-op Vital Signs: Reviewed and stable  Complications: No apparent anesthesia complications

## 2012-07-25 NOTE — Progress Notes (Signed)

## 2012-07-25 NOTE — Transfer of Care (Signed)
Immediate Anesthesia Transfer of Care Note  Patient: [name] [name]  Procedure(s) Performed: Procedure(s) (LRB) with comments: CESAREAN SECTION (N/A)  Patient Location: PACU  Anesthesia Type:Epidural  Level of Consciousness: awake, alert , oriented and patient cooperative  Airway & Oxygen Therapy: Patient Spontanous Breathing  Post-op Assessment: Report given to PACU RN  Post vital signs: Reviewed and stable  Complications: No apparent anesthesia complications

## 2012-07-25 NOTE — Op Note (Signed)
[name] [name], COPPOCK            ACCOUNT NO.:  [id number]  MEDICAL RECORD NO.:  [id number]  LOCATION:  9101                          FACILITY:  [facility]  PHYSICIAN:  Guy Sandifer. Henderson Cloud, M.D. DATE OF BIRTH:  06/14/1986  DATE OF PROCEDURE:  07/25/2012 DATE OF DISCHARGE:                              OPERATIVE REPORT   PREOPERATIVE DIAGNOSIS:  Arrest of descent.  POSTOPERATIVE DIAGNOSIS:  Arrest of descent.  PROCEDURE:  Low-transverse cesarean section.  SURGEON:  Guy Sandifer. Henderson Cloud, MD  ANESTHESIA:  Epidural, Belva Agee, MD  ESTIMATED BLOOD LOSS:  1000 mL.  SPECIMENS:  Placenta to Pathology.  FINDINGS:  Viable female infant.  Apgars, weight, and arterial cord pH pending.  INDICATIONS AND CONSENT:  This patient is a 26 year old patient who progresses to complete and pushing.  After approximately 2-1/2 hours of pushing, the vertex did not descend below +1 station with caput and occiput posterior position.  Cesarean section was recommended.  Potential risks and complications were discussed with the patient and her husband preoperatively including but not limited to, infection, organ damage, bleeding requiring transfusion of blood products with HIV and hepatitis acquisition, DVT, PE, and pneumonia.  All questions were answered and consent was signed on the chart.  PROCEDURE:  The patient was taken to the operating room where she was identified.  Epidural anesthetic was augmented to surgical level, and she was placed in the dorsal supine position with a 15 degree left lateral wedge.  Foley catheter was in place.  She was then prepped and draped per [name] protocol.  Time-out undertaken.  After testing for adequate epidural anesthesia, skin was entered through a Pfannenstiel incision and dissection was carried out in layers to the peritoneum.  Peritoneum was incised and extended superiorly and inferiorly.  Vesicouterine peritoneum was taken down cephalad laterally. The  bladder flap was developed.  The bladder blade was placed.  Uterus was then incised in a low transverse manner and the uterine cavity was entered bluntly with a hemostat.  The uterine incision was then extended cephalad laterally with fingers.  The baby was then delivered from the direct occiput posterior position.  The vertex was elevated through the incision with a vacuum extractor with no pop offs in a gentle traction. Remainder of baby was then delivered.  Good cry and tone was noted. Cord was clamped and cut and the baby was handed to awaiting pediatrics team.  Placenta was manually delivered and sent to Pathology.  Uterine cavity was clean.  Uterus was closed in 2 running locking imbricating layers of 0 Monocryl suture which achieved good hemostasis.  Irrigation was carried out.  Anterior peritoneum was closed in running fashion with 0 Monocryl suture which was also used to reapproximate the pyramidalis muscle in the midline.  Rectus fascia was closed in running fashion with a 0 looped PDS and the skin was closed with clips.  All sponge, instrument, and needle counts were correct.  The patient transferred to recovery room in stable condition.     Guy Sandifer Henderson Cloud, M.D.     JET/MEDQ  D:  07/25/2012  T:  07/25/2012  Job:  [id number]

## 2012-07-25 NOTE — Brief Op Note (Signed)
07/23/2012 - 07/25/2012  1:06 AM  PATIENT:  [name] [name]  26 y.o. female  PRE-OPERATIVE DIAGNOSIS:  Failure to descend  POST-OPERATIVE DIAGNOSIS:  Same  PROCEDURE:  Procedure(s) (LRB) with comments: CESAREAN SECTION (N/A)  SURGEON:  Surgeon(s) and Role:    * Leslie Andrea, MD - Primary  PHYSICIAN ASSISTANT:   ASSISTANTS: none   ANESTHESIA:   epidural  EBL:  Total I/O In: 300 [I.V.:300] Out: -   BLOOD ADMINISTERED:none  DRAINS: Urinary Catheter (Foley)   LOCAL MEDICATIONS USED:  Amount: 0 ml  SPECIMEN:  Source of Specimen:  placenta  DISPOSITION OF SPECIMEN:  PATHOLOGY  COUNTS:  YES  TOURNIQUET:  * No tourniquets in log *  DICTATION: .Other Dictation: Dictation Number [id number]  PLAN OF CARE: Admit to inpatient   PATIENT DISPOSITION:  PACU - hemodynamically stable.   Delay start of Pharmacological VTE agent (>24hrs) due to surgical blood loss or risk of bleeding: not applicable

## 2012-07-25 NOTE — Anesthesia Postprocedure Evaluation (Signed)
  Anesthesia Post-op Note  Patient: [name] [name]  Procedure(s) Performed: Procedure(s) (LRB) with comments: CESAREAN SECTION (N/A)   Patient is awake, responsive, moving her legs, and has signs of resolution of her numbness. Pain and nausea are reasonably well controlled. Vital signs are stable and clinically acceptable. Oxygen saturation is clinically acceptable. There are no apparent anesthetic complications at this time. Patient is ready for discharge.

## 2012-07-25 NOTE — Addendum Note (Signed)
Addendum  created 07/25/12 1610 by Gertie Fey, CRNA   Modules edited:Notes Section

## 2012-07-25 NOTE — Progress Notes (Signed)
Subjective: Postpartum Day 0: Cesarean Delivery Patient reports tolerating PO.    Objective: Vital signs in last 24 hours: Temp:  [98.1 F (36.7 C)-100 F (37.8 C)] 98.8 F (37.1 C) (11/13 0714) Pulse Rate:  [58-123] 75  (11/13 0714) Resp:  [18-32] 18  (11/13 0714) BP: (80-136)/(34-96) 113/58 mmHg (11/13 0714) SpO2:  [95 %-100 %] 95 % (11/13 0714)  Physical Exam:  General: alert and cooperative Lochia: appropriate Uterine Fundus: firm Incision: abd dressing CDI DVT Evaluation: No evidence of DVT seen on physical exam.   Basename 07/25/12 0510 07/24/12 0901  HGB 9.5* 12.0  HCT 28.1* 35.5*    Assessment/Plan: Status post Cesarean section. Doing well postoperatively.  Continue current care CBC in Am.  Wannetta Langland G 07/25/2012, 7:59 AM

## 2012-07-26 ENCOUNTER — Encounter (HOSPITAL_COMMUNITY): Payer: Self-pay | Admitting: Obstetrics and Gynecology

## 2012-07-26 LAB — CBC WITH DIFFERENTIAL/PLATELET
Eosinophils Absolute: 0.3 10*3/uL (ref 0.0–0.7)
Eosinophils Relative: 2 % (ref 0–5)
HCT: 24.3 % — ABNORMAL LOW (ref 36.0–46.0)
Hemoglobin: 8.1 g/dL — ABNORMAL LOW (ref 12.0–15.0)
Lymphs Abs: 3.3 10*3/uL (ref 0.7–4.0)
MCH: 31.3 pg (ref 26.0–34.0)
MCHC: 33.3 g/dL (ref 30.0–36.0)
MCV: 93.8 fL (ref 78.0–100.0)
Monocytes Absolute: 1.8 10*3/uL — ABNORMAL HIGH (ref 0.1–1.0)
Monocytes Relative: 11 % (ref 3–12)
RBC: 2.59 MIL/uL — ABNORMAL LOW (ref 3.87–5.11)

## 2012-07-26 NOTE — Progress Notes (Signed)
Subjective: Postpartum Day 1: Cesarean Delivery Patient reports tolerating PO, + flatus and no problems voiding.    Objective: Vital signs in last 24 hours: Temp:  [97.9 F (36.6 C)-99.2 F (37.3 C)] 97.9 F (36.6 C) (11/14 0530) Pulse Rate:  [69-114] 73  (11/14 0530) Resp:  [16-18] 18  (11/14 0530) BP: (94-116)/(49-71) 109/69 mmHg (11/14 0530) SpO2:  [95 %-97 %] 97 % (11/13 2014)  Physical Exam:  General: alert and cooperative Lochia: appropriate Uterine Fundus: firm Incision: abd dressing CDI DVT Evaluation: No evidence of DVT seen on physical exam. No significant calf/ankle edema.   Basename 07/26/12 0530 07/25/12 0510  HGB 8.1* 9.5*  HCT 24.3* 28.1*    Assessment/Plan: Status post Cesarean section. Doing well postoperatively.  Continue current care.  CURTIS,CAROL G 07/26/2012, 7:43 AM

## 2012-07-27 MED ORDER — IBUPROFEN 600 MG PO TABS: 600.0000 mg | ORAL_TABLET | Freq: Four times a day (QID) | ORAL | Status: DC | PRN

## 2012-07-27 MED ORDER — OXYCODONE-ACETAMINOPHEN 5-325 MG PO TABS: 1.0000 | ORAL_TABLET | ORAL | Status: DC | PRN

## 2012-07-27 NOTE — Progress Notes (Cosign Needed)
Subjective: Postpartum Day 2: Cesarean Delivery Patient reports tolerating PO, + flatus and no problems voiding.    Objective: Vital signs in last 24 hours: Temp:  [97.8 F (36.6 C)-98.8 F (37.1 C)] 98.4 F (36.9 C) (11/15 0513) Pulse Rate:  [73-90] 90  (11/15 0513) Resp:  [18] 18  (11/15 0513) BP: (103-119)/(68-80) 119/80 mmHg (11/15 0513) SpO2:  [96 %] 96 % (11/14 1255)  Physical Exam:  General: cooperative Lochia: appropriate Uterine Fundus: firm Incision: staples intact DVT Evaluation: No significant calf/ankle edema.   Basename 07/26/12 0530 07/25/12 0510  HGB 8.1* 9.5*  HCT 24.3* 28.1*    Assessment/Plan: Status post Cesarean section. Doing well postoperatively.  Continue current care.  Stacye Noori G 07/27/2012, 8:02 AM

## 2012-07-27 NOTE — Discharge Summary (Signed)
Obstetric Discharge Summary Reason for Admission: induction of labor Prenatal Procedures: ultrasound Intrapartum Procedures: cesarean: low cervical, transverse Postpartum Procedures: none Complications-Operative and Postpartum: none Hemoglobin  Date Value Range Status  07/26/2012 8.1* 12.0 - 15.0 g/dL Final     HCT  Date Value Range Status  07/26/2012 24.3* 36.0 - 46.0 % Final    Physical Exam:  General: cooperative Lochia: appropriate Uterine Fundus: firm Incision: healing well, staples intact DVT Evaluation: No evidence of DVT seen on physical exam. No significant calf/ankle edema.  Discharge Diagnoses: Term Pregnancy-delivered  Discharge Information: Date: 07/27/2012 Activity: pelvic rest Diet: routine Medications: PNV, Ibuprofen and Percocet Condition: stable Instructions: refer to practice specific booklet Discharge to: home   Newborn Data: Live born female  Birth Weight: 8 lb 8.5 oz (3870 g) APGAR: 8, 9  Home with mother.  [name] [name] G 07/27/2012, 8:18 AM

## 2012-07-28 ENCOUNTER — Encounter (HOSPITAL_COMMUNITY)
Admission: RE | Admit: 2012-07-28 | Discharge: 2012-07-28 | Disposition: A | Discharge status: Home / Self Care | Payer: BC Managed Care – PPO | Source: Ambulatory Visit | Attending: Obstetrics and Gynecology | Admitting: Obstetrics and Gynecology

## 2012-07-28 DIAGNOSIS — O923 Agalactia: Secondary | ICD-10-CM | POA: Insufficient documentation

## 2012-08-02 ENCOUNTER — Ambulatory Visit (HOSPITAL_COMMUNITY)
Admission: RE | Admit: 2012-08-02 | Discharge: 2012-08-02 | Disposition: A | Discharge status: Home / Self Care | Payer: BC Managed Care – PPO | Source: Ambulatory Visit | Attending: Obstetrics and Gynecology | Admitting: Obstetrics and Gynecology

## 2012-08-02 NOTE — Progress Notes (Signed)
Adult Lactation Consultation Outpatient Visit Note  Patient Name: [name] [name]   BABY: Tana Felts Date of Birth: [birth year]                      DOB: [birth year] Gestational Age at Delivery: [redacted]w[redacted]d     BIRTH WEIGHT: 8-8.5 Type of Delivery: C/S                            WEIGHT TODAY: 8-3.9  Breastfeeding History: Frequency of Breastfeeding: EVERY 2-3 HOURS Length of Feeding: 15-30 MINUTES Voids: 5 Stools: 5 YELLOW  Supplementing / Method:EBM BOTTLE EVERY 1-2 DAYS Pumping:  Type of Pump:SYMPHONY   Frequency:NOW ONCE PER DAY/POST. BEFORE MILK CAME IN EVERY 3 HOURS  Volume:45 MLS    Comments:    Consultation Evaluation:Mom here with her 69 day old baby.  Baby had difficult latch and 9 % weight loss at discharge.  Mom was anxious in the hospital and c/o sore nipples.  Milk came in 3-4 days ago and baby exclusively breastfeeding without supplementation.  Mom denies difficulty with latch or sore nipples.  Breasts full at present.  Observed mom latch baby to both breasts using cross cradle hold.  Demonstrated how to use breast compression to make latch easier and deeper.  Baby nurses well with some off and on chewing.  Instructed on breast massage and compression for better feedings.  Answered many questions and reassured mom.  She was very receptive.  Discussed self care and to contact her MD with s/s of anxiety or depression due to history of both.  Encouraged to call [facility] office for concerns or need for further appointment.  Recommended BF support group.  Initial Feeding Assessment:15 MINUTES EACH BREAST Pre-feed [id number] Post-feed [id number]:1478 Amount Transferred:46 Comments:  Additional Feeding Assessment: Pre-feed Weight: Post-feed Weight: Amount Transferred: Comments:  Additional Feeding Assessment: Pre-feed Weight: Post-feed Weight: Amount Transferred: Comments:  Total Breast milk Transferred this Visit:46  Total Supplement Given: none  Additional  Interventions:   Follow-Up  Will call prn      Hansel Feinstein 08/02/2012, 5:06 PM

## 2012-09-14 ENCOUNTER — Telehealth: Payer: Self-pay

## 2012-09-14 NOTE — Telephone Encounter (Signed)
PT STATES SHE IS ITCHING ALL OVER AND WAS ADVISED TO COME IN AND LET THE DR TAKE A LOOK, HOWEVER SHE STATED SHE COULDN'T COME IN BUT WANTED TO SPEAK WITH SOMEONE OVER THE PHONE. PLEASE CALL [phone]

## 2012-09-14 NOTE — Telephone Encounter (Signed)
Called patient, we have not evaluated her for this.  She has a 42 week old baby and is nursing. I have told her to come in. She also states she wants to get back on Prozac, once she is no longer nursing. She states the itching gets worse at night when she is in bed. I have advised her to change her sheets and wash her current ones in hot water.

## 2013-01-15 ENCOUNTER — Telehealth: Payer: Self-pay

## 2013-01-15 NOTE — Telephone Encounter (Signed)
Needs OV, she has not been seen here in over 1 yr and we have not prescribed medication for her in over 1 yr.  We cannot prescribe without seeing her in the office.

## 2013-01-15 NOTE — Telephone Encounter (Signed)
PATIENT STATES SHE JUST HAD A BABY. RECENTLY GOT PUT BACK ON PROZAC AND IT IS NOT WORKING WELL. PATIENT WANTS AN RX FOR SOMETHING ELSE AND DOES NOT WANT TO COME IN TO BE SEEN. PLEASE ADVISE:[phone]

## 2013-01-15 NOTE — Telephone Encounter (Signed)
With her history she needs to come in. I have advised her I sent message but she REALLY needs to come in for this. Baby 45mo. Old now. [name] [name]

## 2013-01-15 NOTE — Telephone Encounter (Signed)
PT CALLED TO GIVE ANOTHER NUMBER AT [phone]

## 2013-01-16 NOTE — Telephone Encounter (Signed)
Have left message for her to call back about this, she needs office visit.

## 2013-01-16 NOTE — Telephone Encounter (Signed)
Left another message.

## 2013-01-21 ENCOUNTER — Ambulatory Visit: Payer: BC Managed Care – PPO | Admitting: Internal Medicine

## 2013-01-21 VITALS — BP 102/68 | HR 66 | Temp 99.3°F | Resp 16 | Ht 62.0 in | Wt 132.0 lb

## 2013-01-21 DIAGNOSIS — F341 Dysthymic disorder: Secondary | ICD-10-CM

## 2013-01-21 DIAGNOSIS — G4700 Insomnia, unspecified: Secondary | ICD-10-CM

## 2013-01-21 MED ORDER — SERTRALINE HCL 50 MG PO TABS: 50.0000 mg | ORAL_TABLET | Freq: Every day | ORAL | Status: DC

## 2013-01-21 MED ORDER — CLONAZEPAM 0.5 MG PO TABS: ORAL_TABLET | ORAL | Status: DC

## 2013-01-22 NOTE — Progress Notes (Signed)
  Subjective:    Patient ID: [name] [name], female    DOB: [birth year], 27 y.o.   MRN: [id number]  HPI has had recent symptoms of significant depression, anxiety, and trouble sleeping She has a past history of excessive compulsive disorder and is beginning to be troubled by intrusive thoughts and some checking behaviors She has had panic attacks She has been on medication here in the past which was discontinued to allow her to become pregnant. She now has a healthy 92-month-old. She had no problems with depression in the postpartum period. Her husband remains supportive. Her mother who has significant OCD is still supportive. They're both concerned about her recent mood changes. She describes nothing unstable about her mood and has had no irrational behavior. 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 No suicide ideation  She is training for her first triathlon She is upset that her faith is not strong enough to prevent the need for medication She tried to resume Prozac at 20 or 40 mg that she had at home, but very quickly began having terrible nightmares which she would think about all the next day.  Review of Systems No weight loss or night sweats No fever No chest pain or palpitations except when panicky    Objective:   Physical Exam Vital signs stable HEENT clear No thyromegaly Heart regular without murmur Neurological intact Mood is stable tho sad/slightly tearful      Assessment & Plan:  Problem #1 poor sleep Will start Klonopin at bedtime  Problem #2 depression with anxiety and possible relapse of OCD Will start Zoloft which she has never been on/ 50mg  and f/u 3 weeks She does not want to start counseling at this point Meds ordered this encounter  Medications  . sertraline (ZOLOFT) 50 MG tablet    Sig: Take 1 tablet (50 mg total) by mouth daily.     Dispense:  30 tablet    Refill:  3  . clonazePAM (KLONOPIN) 0.5 MG tablet    Sig: 1/2 to 1 tid if needed    Dispense:  90 tablet    Refill:  0

## 2013-01-23 ENCOUNTER — Ambulatory Visit: Payer: PRIVATE HEALTH INSURANCE | Admitting: Internal Medicine

## 2013-03-03 ENCOUNTER — Ambulatory Visit: Payer: BC Managed Care – PPO | Admitting: Internal Medicine

## 2013-03-03 VITALS — BP 110/75 | HR 67 | Temp 98.8°F | Resp 16 | Ht 62.25 in | Wt 139.0 lb

## 2013-03-03 DIAGNOSIS — F429 Obsessive-compulsive disorder, unspecified: Secondary | ICD-10-CM

## 2013-03-03 MED ORDER — CLONAZEPAM 0.5 MG PO TABS: ORAL_TABLET | ORAL | Status: DC

## 2013-03-03 MED ORDER — SERTRALINE HCL 50 MG PO TABS: 150.0000 mg | ORAL_TABLET | Freq: Every day | ORAL | Status: DC

## 2013-03-03 NOTE — Progress Notes (Signed)
  Subjective:    Patient ID: [name] [name], female    DOB: [birth year], 27 y.o.   MRN: [id number]  HPI has advanced to 100 mg of Zoloft over the past 2 weeks and is starting to improve/the compulsions are mostly controlled although she still has some obsessive thinking/intrusive thoughts that are  Depressing//anxiety improved tho a little sleepy from klonopin so tries to avoid it Has noted hard to drink in moderation but only drinks twice a week and never when in charge of child or has to drive No SI Still enjoying work and motherhood    Review of Systems     Objective:   Physical Exam BP 110/75  Pulse 67  Temp(Src) 98.8 F (37.1 C) (Oral)  Resp 16  Ht 5' 2.25" (1.581 m)  Wt 139 lb (63.05 kg)  BMI 25.22 kg/m2  SpO2 99% Mood good/affect appr       Assessment & Plan:  OCD/Anxiety  Meds ordered this encounter  Medications  . clonazePAM (KLONOPIN) 0.5 MG tablet    Sig: 1/2 to 1 tid if needed    Dispense:  90 tablet    Refill:  2  . sertraline (ZOLOFT) 50 MG tablet    Sig: Take 3 tablets (150 mg total) by mouth daily.    Dispense:  90 tablet    Refill:  2  F/u in 6-8 weeks unless everything controlled, then can send my cht mess to get ref

## 2013-04-29 ENCOUNTER — Telehealth: Payer: Self-pay

## 2013-04-29 DIAGNOSIS — F429 Obsessive-compulsive disorder, unspecified: Secondary | ICD-10-CM

## 2013-04-29 DIAGNOSIS — F419 Anxiety disorder, unspecified: Secondary | ICD-10-CM

## 2013-04-29 NOTE — Telephone Encounter (Signed)
PT STATES DR DOOLITTLE WANTED HER TO GO INTO "MY CHART" AND PUT IN A REQUEST FOR HIM TO REFILL HER MEDS AND SHE DIDN'T HAVE TO COME BACK IN TRIED GOING INTO THE CHART AND DIDN'T GET ANY SUCCESS. PLEASE CALL [phone]

## 2013-04-30 MED ORDER — CLONAZEPAM 0.5 MG PO TABS: ORAL_TABLET | ORAL | Status: DC

## 2013-04-30 MED ORDER — SERTRALINE HCL 50 MG PO TABS: 150.0000 mg | ORAL_TABLET | Freq: Every day | ORAL | Status: DC

## 2013-04-30 NOTE — Telephone Encounter (Signed)
Meds ordered this encounter  Medications  . sertraline (ZOLOFT) 50 MG tablet    Sig: Take 3 tablets (150 mg total) by mouth daily.    Dispense:  90 tablet    Refill:  2  . clonazePAM (KLONOPIN) 0.5 MG tablet    Sig: 1/2 to 1 tid if needed    Dispense:  90 tablet    Refill:  2   F/u 3 months--can make appt

## 2013-04-30 NOTE — Telephone Encounter (Signed)
Pt needs refill on sertraline and Klonopin. Please advise

## 2013-04-30 NOTE — Telephone Encounter (Signed)
Called in klonopin rx that printed per Dr. Merla Riches. Advised pt rx at pharmacy.

## 2013-07-26 ENCOUNTER — Other Ambulatory Visit: Payer: Self-pay | Admitting: Internal Medicine

## 2013-08-30 ENCOUNTER — Ambulatory Visit: Payer: BC Managed Care – PPO | Admitting: Internal Medicine

## 2013-08-30 VITALS — BP 98/76 | HR 83 | Temp 98.8°F | Resp 16 | Ht 62.0 in | Wt 140.0 lb

## 2013-08-30 DIAGNOSIS — G4700 Insomnia, unspecified: Secondary | ICD-10-CM

## 2013-08-30 DIAGNOSIS — F988 Other specified behavioral and emotional disorders with onset usually occurring in childhood and adolescence: Secondary | ICD-10-CM

## 2013-08-30 DIAGNOSIS — F429 Obsessive-compulsive disorder, unspecified: Secondary | ICD-10-CM

## 2013-08-30 MED ORDER — CLONAZEPAM 0.5 MG PO TABS: 0.5000 mg | ORAL_TABLET | Freq: Two times a day (BID) | ORAL | Status: DC | PRN

## 2013-08-30 MED ORDER — AMPHETAMINE-DEXTROAMPHETAMINE 20 MG PO TABS: 20.0000 mg | ORAL_TABLET | Freq: Two times a day (BID) | ORAL | Status: DC

## 2013-08-30 NOTE — Progress Notes (Addendum)
Subjective:    Patient ID: [name] [name], female    DOB: 1986-08-16, 27 y.o.   MRN: [id number] This chart was scribed for [name] Sia, MD by [name] [name], ED Scribe. This patient was seen in room 11 and the patient's care was started at 11:04 AM. HPI HPI Comments: [name] [name] is a 27 y.o. female who presents to the [facility] wanting to discuss her medication for her hx of anxiety and OCD. She "weaned" herself off of Zoloft late November because of a diminished libido. She had resumed the birth control pill following delivery but this has never been associated with side effects before. Pt states she feels dizzy sometimes and gets a "tingly" feeling. Since she has quit the biggest issue is not being able to think straight. Since she has had her child it takes a lot more for her to have a panic attack/to experience the anxiety over trivial events which she has been troubled within the past. She has a lot more responsibilities at work and states she needs to be focused and be able to stay on one train of thought. 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 Her interruption of task completion has worsened. She has obsessive worrying. She has started or rather restarted some ritual behavior.  She was diagnosed here in 2010 with attention deficit disorder and excessive compulsive tendencies. She responded well to Adderall for the last 2 years of college but discontinued it after that. Her eye anxiety and excessive thinking responded well to Wellbutrin. She discontinued these things after graduation.  Happily married /very happy as a mother .Pt has recently moved into her new home and also has many things to get settled there/she has not been getting a large amount of sleep at night, often being unable to turn her mind off. Otherwise, pt states she is healthy. She would like to discuss tubal ligation/thinks no more kids since she has [name] [name] and [name] [name]  Patient Active Problem  List   Diagnosis Date Noted  . OCD (obsessive compulsive disorder) 10/19/2011  . Anxiety 10/19/2011   Past Medical History  Diagnosis Date  . Anxiety   . Depression   . Asthma     as a child-no inhaler   Past Surgical History  Procedure Laterality Date  . Cesarean section  07/25/2012    Procedure: CESAREAN SECTION;  Surgeon: [name] Andrea, MD;  Location: [facility];  Service: Obstetrics;  Laterality: N/A;   No Known Allergies Prior to Admission medications   Medication Sig Start Date End Date Taking? Authorizing Provider  clonazePAM (KLONOPIN) 0.5 MG tablet 1/2 to 1 tid if needed 04/30/13  Yes [name] Pearson, MD  ORTHO TRI-CYCLEN LO 0.18/0.215/0.25 MG-25 MCG tab  01/05/13  Yes Historical Provider, MD  ibuprofen (ADVIL,MOTRIN) 600 MG tablet Take 1 tablet (600 mg total) by mouth every 6 (six) hours as needed. 07/27/12   [name] Blonder, NP  Multiple Vitamins-Calcium (ONE-A-DAY WOMENS PO) Take by mouth.      Historical Provider, MD  norethindrone-ethinyl estradiol (TRIPHASIL,CYCLAFEM,ALYACEN) 0.5/0.75/1-35 MG-MCG tablet Take 1 tablet by mouth daily.    Historical Provider, MD  sertraline (ZOLOFT) 50 MG tablet Take 3 tablets (150 mg total) by mouth daily. 04/30/13   [name] Pearson, MD        Review of Systems  Constitutional: Negative for activity change, appetite change, fatigue and unexpected weight change.  Respiratory: Negative for chest tightness and shortness of breath.   Cardiovascular: Negative  for chest pain, palpitations and leg swelling.  Gastrointestinal: Negative for nausea, abdominal pain and diarrhea.  Genitourinary: Negative for frequency.  Neurological: Negative for headaches.       Objective:   Physical Exam  Nursing note and vitals reviewed. Constitutional: She is oriented to person, place, and time. She appears well-developed and well-nourished.  HENT:  Head: Normocephalic.  Eyes: Conjunctivae and EOM are normal. Pupils are equal, round, and  reactive to light.  Neck: Normal range of motion. Neck supple. No thyromegaly present.  Cardiovascular: Normal rate.   Pulmonary/Chest: Effort normal.  Musculoskeletal: Normal range of motion. She exhibits no edema.  Lymphadenopathy:    She has no cervical adenopathy.  Neurological: She is alert and oriented to person, place, and time. No cranial nerve deficit.  Psychiatric: She has a normal mood and affect. Her behavior is normal. Judgment and thought content normal.   Filed Vitals:   08/30/13 1028  BP: 98/76  Pulse: 83  Temp: 98.8 F (37.1 C)  TempSrc: Oral  Resp: 16  Height: 5\' 2"  (1.575 m)  Weight: 140 lb (63.504 kg)  SpO2: 100%   Adult self-report scale for ADHD he reveals a score of 51, with losing things, procrastination, attentional difficulties, and  impatience being important Problems       Assessment & Plan:   Problem #1 obsessive-compulsive disorder with anxiety  Problem #2 attention deficit disorder   Meds ordered this encounter  Medications  . amphetamine-dextroamphetamine (ADDERALL) 20 MG tablet    Sig: Take 1 tablet (20 mg total) by mouth 2 (two) times daily.//To start with half tablets and progress     Dispense:  60 tablet    Refill:  0  . amphetamine-dextroamphetamine (ADDERALL) 20 MG tablet    Sig: Take 1 tablet (20 mg total) by mouth 2 (two) times daily. For 30 d after signing date    Dispense:  60 tablet    Refill:  0  . clonazePAM (KLONOPIN) 0.5 MG tablet--- mainly for sleep     Sig: Take 1 tablet (0.5 mg total) by mouth 2 (two) times daily as needed for anxiety. Or insomnia    Dispense:  60 tablet    Refill:  1       11:11 AM- Discussed treatment plan with pt at bedside. Pt verbalized understanding and agreement with plan.  I personally performed the services described in this documentation, which was scribed in my presence. The recorded information has been reviewed and is accurate.

## 2013-09-11 NOTE — Progress Notes (Signed)
Made appointment for 2/11 with Dr Merla Riches.

## 2013-09-26 ENCOUNTER — Other Ambulatory Visit: Payer: Self-pay | Admitting: Internal Medicine

## 2013-09-27 NOTE — Telephone Encounter (Signed)
Called pt to verify that she is not taking zoloft. She stated that she is not, and I sent a denial.

## 2013-10-10 ENCOUNTER — Encounter (HOSPITAL_COMMUNITY): Payer: Self-pay | Admitting: *Deleted

## 2013-10-16 ENCOUNTER — Encounter (HOSPITAL_COMMUNITY): Payer: Self-pay | Admitting: Pharmacist

## 2013-10-17 NOTE — H&P (Signed)
Konrad Doloresshley J McCormick  DICTATION # [id number] CSN# [id number]   Meriel PicaHOLLAND,Heather Streeper M, MD 10/17/2013 9:44 AM

## 2013-10-23 ENCOUNTER — Encounter: Payer: Self-pay | Admitting: Internal Medicine

## 2013-10-23 ENCOUNTER — Ambulatory Visit (INDEPENDENT_AMBULATORY_CARE_PROVIDER_SITE_OTHER): Payer: BC Managed Care – PPO | Admitting: Internal Medicine

## 2013-10-23 VITALS — BP 108/68 | HR 77 | Temp 99.3°F | Resp 16 | Ht 61.5 in | Wt 135.0 lb

## 2013-10-23 DIAGNOSIS — Z23 Encounter for immunization: Secondary | ICD-10-CM

## 2013-10-23 DIAGNOSIS — F988 Other specified behavioral and emotional disorders with onset usually occurring in childhood and adolescence: Secondary | ICD-10-CM

## 2013-10-23 DIAGNOSIS — F419 Anxiety disorder, unspecified: Secondary | ICD-10-CM

## 2013-10-23 DIAGNOSIS — F411 Generalized anxiety disorder: Secondary | ICD-10-CM

## 2013-10-23 MED ORDER — AMPHETAMINE-DEXTROAMPHETAMINE 20 MG PO TABS: 20.0000 mg | ORAL_TABLET | Freq: Two times a day (BID) | ORAL | Status: DC

## 2013-10-23 MED ORDER — AMPHETAMINE-DEXTROAMPHETAMINE 20 MG PO TABS
20.0000 mg | ORAL_TABLET | Freq: Two times a day (BID) | ORAL | Status: DC
Start: 2013-10-23 — End: 2014-05-26

## 2013-10-23 NOTE — Progress Notes (Signed)
Subjective:    Patient ID: [name] [name], female    DOB: 1985-11-08, 28 y.o.   MRN: [id number] This chart was scribed for Ellamae Siaobert Williams Dietrick, MD by Valera CastleSteven Perry, ED Scribe. This patient was seen in room 25 and the patient's care was started at 3:20 PM.  Chief Complaint  Patient presents with  . Medication Refill    ADDERALL   HPI [name] [name] is a 28 y.o. female Pt [facility] Adderall [facility]. Pt has h/o OCD and anxiety.  She reports the Adderall has helped her out a lot. She reports some anxiety, episodes of dysphoric mood, but reports they are manageable. She states when she comes home after work, she feels irritable as the Adderall wears off, and is wondering if that is normal. She reports mild, intermittent headaches with taking her Adderall. She states she rarely takes Klonopin, usually for sleep. She denies having any panic attacks. She reports when she gets anxious, she will listen to music which gives relief along with her medication.   She states she no longer wants to wait for birth control. She has appointment set up with Dr. Marcelle OverlieHolland for BTL. She is worried about her h/o anxiety with coming off current birth control.   PCP - Tonye PearsonOLITTLE, [place] Fant P, MD  Patient Active Problem List   Diagnosis Date Noted  . OCD (obsessive compulsive disorder) 10/19/2011  . Anxiety 10/19/2011   Prior to Admission medications   Medication Sig Start Date End Date Taking? Authorizing Provider  amphetamine-dextroamphetamine (ADDERALL) 20 MG tablet Take 1 tablet (20 mg total) by mouth 2 (two) times daily. 08/30/13  Yes Tonye Pearsonobert P Allix Blomquist, MD  clonazePAM (KLONOPIN) 0.5 MG tablet Take 1 tablet (0.5 mg total) by mouth 2 (two) times daily as needed for anxiety. Or insomnia 08/30/13  Yes Tonye Pearsonobert P Maleaha Hughett, MD  ORTHO TRI-CYCLEN LO 0.18/0.215/0.25 MG-25 MCG tab 1 tablet daily.  01/05/13  Yes Historical Provider, MD   Review of Systems  Psychiatric/Behavioral: Negative for dysphoric  mood. The patient is not nervous/anxious.       Objective:   Physical Exam Nursing note and vitals reviewed. Constitutional: Pt is oriented to person, place, and time. Pt appears well-developed and well-nourished. No distress.  HENT:  Head: Normocephalic and atraumatic.  Eyes: EOM are normal.  Neck: Neck supple.  Cardiovascular: Normal rate. Pulmonary/Chest: Effort normal. No respiratory distress.  Abdominal:  Musculoskeletal: Normal range of motion.  Neurological: Pt is alert and oriented to person, place, and time.  Skin: Skin is warm and dry.  Psychiatric: Pt has a normal mood and affect. Pt's behavior is normal.   BP 108/68  Pulse 77  Temp(Src) 99.3 F (37.4 C) (Oral)  Resp 16  Ht 5' 1.5" (1.562 m)  Wt 135 lb (61.236 kg)  BMI 25.10 kg/m2  SpO2 98%  LMP 10/15/2013  Breastfeeding? No     Assessment & Plan:  ADD (attention deficit disorder) without hyperactivity  Need for prophylactic vaccination and inoculation against influenza - Plan: Flu Vaccine QUAD 36+ mos IM  Anxiety  Meds ordered this encounter  Medications  . amphetamine-dextroamphetamine (ADDERALL) 20 MG tablet    Sig: Take 1 tablet (20 mg total) by mouth 2 (two) times daily.    Dispense:  60 tablet    Refill:  0  . amphetamine-dextroamphetamine (ADDERALL) 20 MG tablet    Sig: Take 1 tablet (20 mg total) by mouth 2 (two) times daily. For 30d after signing date  Dispense:  60 tablet    Refill:  0  . amphetamine-dextroamphetamine (ADDERALL) 20 MG tablet    Sig: Take 1 tablet (20 mg total) by mouth 2 (two) times daily. For 60 days after signing date    Dispense:  60 tablet    Refill:  0   6 mo     I have completed the patient encounter in its entirety as documented by the scribe, with editing by me where necessary. Krissie Merrick P. Merla Riches, M.D.

## 2013-10-30 ENCOUNTER — Ambulatory Visit (HOSPITAL_COMMUNITY): Payer: BC Managed Care – PPO | Admitting: Anesthesiology

## 2013-10-30 ENCOUNTER — Encounter (HOSPITAL_COMMUNITY)
Admission: RE | Disposition: A | Discharge status: Home / Self Care | Payer: Self-pay | Source: Ambulatory Visit | Attending: Obstetrics and Gynecology

## 2013-10-30 ENCOUNTER — Ambulatory Visit (HOSPITAL_COMMUNITY)
Admission: RE | Admit: 2013-10-30 | Discharge: 2013-10-30 | Disposition: A | Discharge status: Home / Self Care | Payer: BC Managed Care – PPO | Source: Ambulatory Visit | Attending: Obstetrics and Gynecology | Admitting: Obstetrics and Gynecology

## 2013-10-30 ENCOUNTER — Encounter (HOSPITAL_COMMUNITY): Payer: BC Managed Care – PPO | Admitting: Anesthesiology

## 2013-10-30 ENCOUNTER — Encounter (HOSPITAL_COMMUNITY): Payer: Self-pay | Admitting: Anesthesiology

## 2013-10-30 DIAGNOSIS — J45909 Unspecified asthma, uncomplicated: Secondary | ICD-10-CM | POA: Insufficient documentation

## 2013-10-30 DIAGNOSIS — Z302 Encounter for sterilization: Secondary | ICD-10-CM | POA: Insufficient documentation

## 2013-10-30 DIAGNOSIS — F341 Dysthymic disorder: Secondary | ICD-10-CM | POA: Insufficient documentation

## 2013-10-30 HISTORY — PX: LAPAROSCOPIC TUBAL LIGATION: SHX1937

## 2013-10-30 LAB — CBC
HCT: 37.3 % (ref 36.0–46.0)
Hemoglobin: 12.7 g/dL (ref 12.0–15.0)
MCH: 30.2 pg (ref 26.0–34.0)
MCHC: 34 g/dL (ref 30.0–36.0)
MCV: 88.6 fL (ref 78.0–100.0)
Platelets: 214 10*3/uL (ref 150–400)
RBC: 4.21 MIL/uL (ref 3.87–5.11)
RDW: 12.3 % (ref 11.5–15.5)
WBC: 9.9 10*3/uL (ref 4.0–10.5)

## 2013-10-30 LAB — PREGNANCY, URINE: PREG TEST UR: NEGATIVE

## 2013-10-30 SURGERY — LIGATION, FALLOPIAN TUBE, LAPAROSCOPIC
Anesthesia: General | Site: Abdomen | Laterality: Bilateral

## 2013-10-30 MED ORDER — SUCCINYLCHOLINE CHLORIDE 20 MG/ML IJ SOLN
INTRAMUSCULAR | Status: AC
  Filled 2013-10-30: qty 10

## 2013-10-30 MED ORDER — LACTATED RINGERS IV SOLN
INTRAVENOUS | Status: DC
  Administered 2013-10-30 (×2): via INTRAVENOUS

## 2013-10-30 MED ORDER — PROPOFOL 10 MG/ML IV BOLUS
INTRAVENOUS | Status: DC | PRN
  Administered 2013-10-30: 180 mg via INTRAVENOUS

## 2013-10-30 MED ORDER — BUPIVACAINE HCL (PF) 0.25 % IJ SOLN
INTRAMUSCULAR | Status: AC
  Filled 2013-10-30: qty 30

## 2013-10-30 MED ORDER — SUCCINYLCHOLINE CHLORIDE 20 MG/ML IJ SOLN
INTRAMUSCULAR | Status: DC | PRN
  Administered 2013-10-30: 12 mg via INTRAVENOUS

## 2013-10-30 MED ORDER — LIDOCAINE HCL (CARDIAC) 20 MG/ML IV SOLN
INTRAVENOUS | Status: AC
  Filled 2013-10-30: qty 5

## 2013-10-30 MED ORDER — SCOPOLAMINE 1 MG/3DAYS TD PT72
MEDICATED_PATCH | TRANSDERMAL | Status: AC
  Administered 2013-10-30: 1.5 mg via TRANSDERMAL
  Filled 2013-10-30: qty 1

## 2013-10-30 MED ORDER — FENTANYL CITRATE 0.05 MG/ML IJ SOLN
25.0000 ug | INTRAMUSCULAR | Status: DC | PRN
  Administered 2013-10-30: 50 ug via INTRAVENOUS
  Administered 2013-10-30: 25 ug via INTRAVENOUS
  Administered 2013-10-30: 50 ug via INTRAVENOUS

## 2013-10-30 MED ORDER — DEXAMETHASONE SODIUM PHOSPHATE 10 MG/ML IJ SOLN
INTRAMUSCULAR | Status: DC | PRN
  Administered 2013-10-30: 10 mg via INTRAVENOUS

## 2013-10-30 MED ORDER — KETOROLAC TROMETHAMINE 30 MG/ML IJ SOLN
INTRAMUSCULAR | Status: AC
  Filled 2013-10-30: qty 1

## 2013-10-30 MED ORDER — MIDAZOLAM HCL 2 MG/2ML IJ SOLN
INTRAMUSCULAR | Status: DC | PRN
  Administered 2013-10-30: 2 mg via INTRAVENOUS

## 2013-10-30 MED ORDER — PROPOFOL 10 MG/ML IV EMUL
INTRAVENOUS | Status: AC
  Filled 2013-10-30: qty 20

## 2013-10-30 MED ORDER — OXYCODONE-ACETAMINOPHEN 5-325 MG PO TABS
1.0000 | ORAL_TABLET | ORAL | Status: DC | PRN
  Administered 2013-10-30: 1 via ORAL

## 2013-10-30 MED ORDER — ONDANSETRON HCL 4 MG/2ML IJ SOLN
INTRAMUSCULAR | Status: AC
  Filled 2013-10-30: qty 2

## 2013-10-30 MED ORDER — KETOROLAC TROMETHAMINE 30 MG/ML IJ SOLN
INTRAMUSCULAR | Status: DC | PRN
  Administered 2013-10-30: 30 mg via INTRAVENOUS

## 2013-10-30 MED ORDER — LIDOCAINE HCL (CARDIAC) 20 MG/ML IV SOLN
INTRAVENOUS | Status: DC | PRN
  Administered 2013-10-30: 30 mg via INTRAVENOUS
  Administered 2013-10-30: 70 mg via INTRAVENOUS

## 2013-10-30 MED ORDER — ONDANSETRON HCL 4 MG/2ML IJ SOLN
INTRAMUSCULAR | Status: DC | PRN
  Administered 2013-10-30: 4 mg via INTRAVENOUS

## 2013-10-30 MED ORDER — HYDROMORPHONE HCL PF 1 MG/ML IJ SOLN
INTRAMUSCULAR | Status: AC
  Filled 2013-10-30: qty 1

## 2013-10-30 MED ORDER — HYDROMORPHONE HCL PF 1 MG/ML IJ SOLN
1.0000 mg | Freq: Once | INTRAMUSCULAR | Status: AC
  Administered 2013-10-30: 1 mg via INTRAVENOUS

## 2013-10-30 MED ORDER — FENTANYL CITRATE 0.05 MG/ML IJ SOLN
INTRAMUSCULAR | Status: AC
  Filled 2013-10-30: qty 5

## 2013-10-30 MED ORDER — OXYCODONE-ACETAMINOPHEN 5-325 MG PO TABS
ORAL_TABLET | ORAL | Status: AC
  Filled 2013-10-30: qty 1

## 2013-10-30 MED ORDER — SCOPOLAMINE 1 MG/3DAYS TD PT72
1.0000 | MEDICATED_PATCH | TRANSDERMAL | Status: DC
  Administered 2013-10-30: 1.5 mg via TRANSDERMAL

## 2013-10-30 MED ORDER — FENTANYL CITRATE 0.05 MG/ML IJ SOLN
INTRAMUSCULAR | Status: AC
  Filled 2013-10-30: qty 2

## 2013-10-30 MED ORDER — SODIUM CHLORIDE 0.9 % IJ SOLN
INTRAMUSCULAR | Status: AC
  Filled 2013-10-30: qty 10

## 2013-10-30 MED ORDER — FENTANYL CITRATE 0.05 MG/ML IJ SOLN
INTRAMUSCULAR | Status: DC | PRN
  Administered 2013-10-30 (×5): 50 ug via INTRAVENOUS

## 2013-10-30 MED ORDER — BUPIVACAINE HCL (PF) 0.25 % IJ SOLN
INTRAMUSCULAR | Status: DC | PRN
  Administered 2013-10-30: 9 mL

## 2013-10-30 MED ORDER — MEPERIDINE HCL 25 MG/ML IJ SOLN: 6.2500 mg | INTRAMUSCULAR | Status: DC | PRN

## 2013-10-30 MED ORDER — METOCLOPRAMIDE HCL 5 MG/ML IJ SOLN: 10.0000 mg | Freq: Once | INTRAMUSCULAR | Status: DC | PRN

## 2013-10-30 MED ORDER — DEXAMETHASONE SODIUM PHOSPHATE 10 MG/ML IJ SOLN
INTRAMUSCULAR | Status: AC
  Filled 2013-10-30: qty 2

## 2013-10-30 MED ORDER — HYDROCODONE-IBUPROFEN 7.5-200 MG PO TABS: 1.0000 | ORAL_TABLET | Freq: Three times a day (TID) | ORAL | Status: DC | PRN

## 2013-10-30 MED ORDER — MIDAZOLAM HCL 2 MG/2ML IJ SOLN
INTRAMUSCULAR | Status: AC
  Filled 2013-10-30: qty 2

## 2013-10-30 SURGICAL SUPPLY — 22 items
CATH ROBINSON RED A/P 16FR (CATHETERS) ×3 IMPLANT
CLIP FILSHIE TUBAL LIGA STRL (Clip) ×3 IMPLANT
CLOSURE WOUND 1/4 X3 (GAUZE/BANDAGES/DRESSINGS) ×1
CLOTH BEACON ORANGE TIMEOUT ST (SAFETY) ×3 IMPLANT
DERMABOND ADVANCED (GAUZE/BANDAGES/DRESSINGS) ×2
DERMABOND ADVANCED .7 DNX12 (GAUZE/BANDAGES/DRESSINGS) ×1 IMPLANT
DRSG COVADERM PLUS 2X2 (GAUZE/BANDAGES/DRESSINGS) ×3 IMPLANT
GLOVE BIO SURGEON STRL SZ7 (GLOVE) ×3 IMPLANT
GLOVE BIOGEL PI IND STRL 7.0 (GLOVE) ×4 IMPLANT
GLOVE BIOGEL PI INDICATOR 7.0 (GLOVE) ×8
GLOVE ECLIPSE 7.0 STRL STRAW (GLOVE) ×15 IMPLANT
GLOVE SURG SS PI 7.0 STRL IVOR (GLOVE) ×12 IMPLANT
GOWN STRL REUS W/TWL LRG LVL3 (GOWN DISPOSABLE) ×6 IMPLANT
NEEDLE INSUFFLATION 120MM (ENDOMECHANICALS) ×3 IMPLANT
PACK LAPAROSCOPY BASIN (CUSTOM PROCEDURE TRAY) ×3 IMPLANT
STRIP CLOSURE SKIN 1/4X3 (GAUZE/BANDAGES/DRESSINGS) ×2 IMPLANT
SUT VICRYL RAPIDE 4/0 PS 2 (SUTURE) ×3 IMPLANT
SYR 20CC LL (SYRINGE) ×3 IMPLANT
TOWEL OR 17X24 6PK STRL BLUE (TOWEL DISPOSABLE) ×6 IMPLANT
TROCAR XCEL DIL TIP R 11M (ENDOMECHANICALS) ×3 IMPLANT
WARMER LAPAROSCOPE (MISCELLANEOUS) ×3 IMPLANT
WATER STERILE IRR 1000ML POUR (IV SOLUTION) ×3 IMPLANT

## 2013-10-30 NOTE — Progress Notes (Signed)
The patient was re-examined with no change in status 

## 2013-10-30 NOTE — Op Note (Signed)
Preoperative diagnosis: Request permanent sterilization  Postoperative diagnosis: Same  Procedure: Laparoscopic tubal ligation by Filshie clip application  Surgeon: Marcelle OverlieHolland  Anesthesia: Gen.  Complications: None  Drains: In and out catheter  Procedure and findings:  The patient was taken the operating room after an adequate level of general anesthesia was obtained with the legs in stirrups the abdomen perineum and vagina were prepped and draped in usual fashion for laparoscopy. Appropriate timeout for taken prior to prepping. The bladder was drained, EUA carried out uterus was normal size mobile adnexa negative. Hulka tenaculum was positioned attention directed to the abdomen where the subumbilical area was infiltrated with quarter percent Marcaine plain, small incision was made in the varies needle was introduced without difficulty. Its intra-abdominal position was was verified by pressure water testing. After 2-1/2 L pneumoperitoneum syncopated lap scopic trocar and sleeve were then introduced without difficulty. There was no evidence of any bleeding or trauma, the patient was then placed in Trendelenburg and the pelvic findings as follows:  Upper abdomen was unremarkable the pelvic findings anterior posterior cul-de-sac spaces were free and clear uterus bilateral adnexa were normal no evidence of any endometriosis or other abnormalities. 4-5 cc quarter percent Marcaine plain were then draped across each tube from the cornu to the fimbriated end, Filshie clip applicator was backloaded Filshie clip applied a right angle 2 cm from the cornu on the right side after carefully identifying the tube in having trace about the fimbriated end. The exact same repeated on the opposite side. Clip application was photo documented. Its was removed, gas allowed to escape, 4-0 Vicryl subcuticular closure she tolerated this well went to recovery room in good condition.  Dictated with [facility]  Ramsey Guadamuz M.  Milana ObeyHolland M.D.

## 2013-10-30 NOTE — Transfer of Care (Signed)
Immediate Anesthesia Transfer of Care Note  Patient: [name] [name]  Procedure(s) Performed: Procedure(s) with comments: LAPAROSCOPIC TUBAL LIGATION (Bilateral) - with Filshie Clips  Patient Location: PACU  Anesthesia Type:General  Level of Consciousness: awake, oriented and patient cooperative  Airway & Oxygen Therapy: Patient Spontanous Breathing and Patient connected to nasal cannula oxygen  Post-op Assessment: Report given to PACU RN and Post -op Vital signs reviewed and stable  Post vital signs: Reviewed and stable  Complications: No apparent anesthesia complications

## 2013-10-30 NOTE — Anesthesia Preprocedure Evaluation (Signed)
Anesthesia Evaluation  Patient identified by MRN, date of birth, ID band Patient awake    Reviewed: Allergy & Precautions, H&P , NPO status , Patient's Chart, lab work & pertinent test results  Airway Mallampati: III TM Distance: >3 FB Neck ROM: Full    Dental no notable dental hx. (+) Teeth Intact   Pulmonary asthma ,  breath sounds clear to auscultation  Pulmonary exam normal       Cardiovascular negative cardio ROS  Rhythm:Regular Rate:Normal     Neuro/Psych PSYCHIATRIC DISORDERS Anxiety Depression OCD   GI/Hepatic negative GI ROS, Neg liver ROS,   Endo/Other  negative endocrine ROS  Renal/GU negative Renal ROS  negative genitourinary   Musculoskeletal negative musculoskeletal ROS (+)   Abdominal   Peds  Hematology negative hematology ROS (+)   Anesthesia Other Findings   Reproductive/Obstetrics Undesired fertility                           Anesthesia Physical Anesthesia Plan  ASA: II  Anesthesia Plan: General   Post-op Pain Management:    Induction: Intravenous  Airway Management Planned: Oral ETT  Additional Equipment:   Intra-op Plan:   Post-operative Plan: Extubation in OR  Informed Consent: I have reviewed the patients History and Physical, chart, labs and discussed the procedure including the risks, benefits and alternatives for the proposed anesthesia with the patient or authorized representative who has indicated his/her understanding and acceptance.   Dental advisory given  Plan Discussed with: CRNA, Anesthesiologist and Surgeon  Anesthesia Plan Comments:         Anesthesia Quick Evaluation

## 2013-10-30 NOTE — Anesthesia Postprocedure Evaluation (Signed)
  Anesthesia Post-op Note  Patient: [name] [name]  Procedure(s) Performed: Procedure(s) with comments: LAPAROSCOPIC TUBAL LIGATION (Bilateral) - with Filshie Clips  Patient Location: PACU  Anesthesia Type:General  Level of Consciousness: awake, alert  and oriented  Airway and Oxygen Therapy: Patient Spontanous Breathing  Post-op Pain: mild  Post-op Assessment: Post-op Vital signs reviewed, Patient's Cardiovascular Status Stable, Respiratory Function Stable, Patent Airway, No signs of Nausea or vomiting and Pain level controlled  Post-op Vital Signs: Reviewed and stable  Complications: No apparent anesthesia complications

## 2013-10-30 NOTE — Discharge Instructions (Signed)

## 2013-10-31 ENCOUNTER — Encounter (HOSPITAL_COMMUNITY): Payer: Self-pay | Admitting: Obstetrics and Gynecology

## 2013-10-31 NOTE — H&P (Signed)
NAMZollie Scale:  [name], [name]            ACCOUNT NO.:  [id number]  MEDICAL RECORD NO.:  [id number]  LOCATION:                                 FACILITY:  PHYSICIAN:  Duke Salviaichard M. Marcelle OverlieHolland, M.D.DATE OF BIRTH:  1986/07/18  DATE OF ADMISSION:  10/30/2013 DATE OF DISCHARGE:                             HISTORY & PHYSICAL   CHIEF COMPLAINT:  Request permanent sterilization.  HISTORY OF PRESENT ILLNESS:  A 28 year old, G1, P1, she and her husband also have another child and she presents at this time for permanent sterilization.  She is sure she would not want to be pregnant again under any circumstance.  We have previously discussed both hysteroscopic tubal versus lap tubal with Filshie clips and she would prefer the latter.  The specific risks related to permanence of the procedure, failure rate at 2-11/998, other risks related to bleeding, infection, other complications that may require open additional surgery reviewed with her, which she understands and accepts.  ALLERGIES:  NOVOCAIN.  OBSTETRICAL HISTORY:  Cesarean section in 2013.  OTHER MEDICATIONS:  None.  FAMILY HISTORY:  Significant for father who had an MI at age 28, father and paternal grandmother with hypertension.  REVIEW OF SYSTEMS:  Significant for prior history of childhood asthma. Not currently on any medications, and history of anxiety and mild depression.  PHYSICAL EXAMINATION:  VITAL SIGNS:  Temperature 98.2, blood pressure 120/72. HEENT:  Unremarkable. NECK:  Supple without masses. LUNGS:  Clear. CARDIOVASCULAR:  Regular rate and rhythm without murmurs, rubs, or gallops. BREASTS:  Without masses. ABDOMEN:  Soft, flat, nontender.  Vulva, vagina, cervix normal.  Uterus, mid position, normal size.  Adnexa negative. EXTREMITIES:  Unremarkable. NEUROLOGIC:  Unremarkable.  IMPRESSION:  Request permanent sterilization.  PLAN:  Lap tubal with Filshie clips.  Procedure and risks were reviewed as above.     [name] [name]  M. Marcelle OverlieHolland, M.D.     [facility]/MEDQ  D:  10/17/2013  T:  10/18/2013  Job:  [id number]

## 2014-05-26 ENCOUNTER — Ambulatory Visit (INDEPENDENT_AMBULATORY_CARE_PROVIDER_SITE_OTHER): Payer: BC Managed Care – PPO | Admitting: Physician Assistant

## 2014-05-26 VITALS — BP 112/66 | HR 94 | Temp 98.6°F | Resp 18 | Ht 62.5 in | Wt 143.0 lb

## 2014-05-26 DIAGNOSIS — J029 Acute pharyngitis, unspecified: Secondary | ICD-10-CM

## 2014-05-26 DIAGNOSIS — J069 Acute upper respiratory infection, unspecified: Secondary | ICD-10-CM

## 2014-05-26 LAB — POCT RAPID STREP A (OFFICE): RAPID STREP A SCREEN: NEGATIVE

## 2014-05-26 MED ORDER — MAGIC MOUTHWASH W/LIDOCAINE: 10.0000 mL | ORAL | Status: DC | PRN

## 2014-05-26 MED ORDER — GUAIFENESIN ER 1200 MG PO TB12: 1.0000 | ORAL_TABLET | Freq: Two times a day (BID) | ORAL | Status: DC | PRN

## 2014-05-26 MED ORDER — IPRATROPIUM BROMIDE 0.03 % NA SOLN: 2.0000 | Freq: Two times a day (BID) | NASAL | Status: DC

## 2014-05-26 NOTE — Progress Notes (Signed)
Subjective:    Patient ID: [name] [name], female    DOB: [birth year], 28 y.o.   MRN: [id number]   PCP: Tonye Pearson, MD  Chief Complaint  Patient presents with  . Sore Throat    very painful since yesterday     Medications, allergies, past medical history, surgical history, family history, social history and problem list reviewed and updated.  Patient Active Problem List   Diagnosis Date Noted  . OCD (obsessive compulsive disorder) 10/19/2011  . Anxiety 10/19/2011    Prior to Admission medications   Not on File    HPI  Presents with illness that began yesterday.  Began as a weird feeling in her throat, which rapidly became sore.  Hurts to swallow.  Very fatigued, and awoke repeatedly during the night with throat pain.  Some nasal congestion and drainage.  No cough.  No fever, chills or GI symptoms.  Her 28 year old has a runny nose, and "the whole house" had a cold a week ago, which resolved.  Review of Systems As above.    Objective:   Physical Exam  Vitals reviewed. Constitutional: She is oriented to person, place, and time. Vital signs are normal. She appears well-developed and well-nourished. She is active and cooperative. No distress.  BP 112/66  Pulse 94  Temp(Src) 98.6 F (37 C) (Oral)  Resp 18  Ht 5' 2.5" (1.588 m)  Wt 143 lb (64.864 kg)  BMI 25.72 kg/m2  SpO2 99%  LMP 05/25/2014  Breastfeeding? No   HENT:  Head: Normocephalic and atraumatic.  Right Ear: Hearing, tympanic membrane, external ear and ear canal normal. Tympanic membrane is not injected, not erythematous, not retracted and not bulging.  Left Ear: Hearing, tympanic membrane, external ear and ear canal normal. Tympanic membrane is not injected, not erythematous, not retracted and not bulging.  Nose: Mucosal edema present.  No foreign bodies. Right sinus exhibits no maxillary sinus tenderness and no frontal sinus tenderness. Left sinus exhibits no maxillary sinus tenderness and no  frontal sinus tenderness.  Mouth/Throat: Uvula is midline, oropharynx is clear and moist and mucous membranes are normal. No uvula swelling. No oropharyngeal exudate.  Eyes: Conjunctivae, EOM and lids are normal. Pupils are equal, round, and reactive to light. Right eye exhibits no discharge. Left eye exhibits no discharge. No scleral icterus.  Neck: Trachea normal, normal range of motion and full passive range of motion without pain. Neck supple. No mass and no thyromegaly present.  Cardiovascular: Normal rate, regular rhythm and normal heart sounds.   Pulmonary/Chest: Effort normal and breath sounds normal.  Lymphadenopathy:       Head (right side): No submandibular, no preauricular, no posterior auricular and no occipital adenopathy present.       Head (left side): No submandibular, no preauricular and no occipital adenopathy present.    She has no cervical adenopathy.       Right: No supraclavicular adenopathy present.       Left: No supraclavicular adenopathy present.  Enlarged but non-tender tonsillar lymph nodes. She reports these are baseline since she had Mono.  Neurological: She is alert and oriented to person, place, and time. She has normal strength. No cranial nerve deficit or sensory deficit.  Skin: Skin is warm, dry and intact. No rash noted.  Psychiatric: Her speech is normal and behavior is normal. Her mood appears anxious. Her affect is not inappropriate. She does not exhibit a depressed mood.      Results for orders placed  in visit on 05/26/14  POCT RAPID STREP A (OFFICE)      Result Value Ref Range   Rapid Strep A Screen Negative  Negative       Assessment & Plan:  1. Acute pharyngitis, unspecified pharyngitis type - POCT rapid strep A - Culture, Group A Strep - Alum & Mag Hydroxide-Simeth (MAGIC MOUTHWASH W/LIDOCAINE) SOLN; Take 10 mLs by mouth every 2 (two) hours as needed for mouth pain.  Dispense: 360 mL; Refill: 0  2. Viral URI - ipratropium (ATROVENT) 0.03  % nasal spray; Place 2 sprays into both nostrils 2 (two) times daily.  Dispense: 30 mL; Refill: 0 - Guaifenesin (MUCINEX MAXIMUM STRENGTH) 1200 MG TB12; Take 1 tablet (1,200 mg total) by mouth every 12 (twelve) hours as needed.  Dispense: 14 tablet; Refill: 1  Supportive care.  Anticipatory guidance.  RTC if symptoms worsen/persist.   Fernande Bras, PA-C Physician Assistant-Certified [facility]

## 2014-05-26 NOTE — Patient Instructions (Signed)
Get plenty of rest and drink at least 64 ounces of water daily. 

## 2014-05-28 LAB — CULTURE, GROUP A STREP: ORGANISM ID, BACTERIA: NORMAL

## 2015-01-23 ENCOUNTER — Other Ambulatory Visit: Payer: Self-pay | Admitting: Obstetrics and Gynecology

## 2015-01-26 LAB — CYTOLOGY - PAP

## 2015-03-02 ENCOUNTER — Ambulatory Visit (INDEPENDENT_AMBULATORY_CARE_PROVIDER_SITE_OTHER): Payer: BLUE CROSS/BLUE SHIELD | Admitting: Internal Medicine

## 2015-03-02 VITALS — BP 102/80 | HR 98 | Temp 98.7°F | Resp 18 | Ht 62.5 in | Wt 124.4 lb

## 2015-03-02 DIAGNOSIS — T148 Other injury of unspecified body region: Secondary | ICD-10-CM

## 2015-03-02 DIAGNOSIS — W57XXXA Bitten or stung by nonvenomous insect and other nonvenomous arthropods, initial encounter: Secondary | ICD-10-CM

## 2015-03-02 DIAGNOSIS — T7840XA Allergy, unspecified, initial encounter: Secondary | ICD-10-CM

## 2015-03-02 MED ORDER — PREDNISONE 20 MG PO TABS: ORAL_TABLET | ORAL | Status: DC

## 2015-03-02 MED ORDER — METHYLPREDNISOLONE ACETATE 80 MG/ML IJ SUSP
80.0000 mg | Freq: Once | INTRAMUSCULAR | Status: AC
  Administered 2015-03-02: 80 mg via INTRAMUSCULAR

## 2015-03-02 MED ORDER — SERTRALINE HCL 50 MG PO TABS: 50.0000 mg | ORAL_TABLET | Freq: Every day | ORAL | Status: DC

## 2015-03-02 NOTE — Patient Instructions (Signed)
Recheck Saturday morning if not greatly improved

## 2015-03-02 NOTE — Progress Notes (Signed)
Subjective:  This chart was scribed for  Ellamae Sia MD, by Veverly Fells, at [facility]  This patient was seen in room 2 and the patient's care was started at 3:19 PM.    Patient ID: [name] [name], female    DOB: Jan 27, 1986, 29 y.o.   MRN: [id number] Chief Complaint  Patient presents with  . Insect Bite    happened fri. horse trailor flipped over and while she was helping horse,she was climbing in and out a ditch and got bitten    HPI  HPI Comments: [name] [name] is a 29 y.o. female who presents to the [facility] complaining of painful bumps all over her body onset three days ago from what she thinks is from an insect bite that has been worsening for the last two days. She has associated symptoms of nausea and aching in her joints.  She was wearing blue jeans, tall socks and boots when the incident occurred.   Patient was going camping and was in an accident while going to the camp site.  The horse trailer behind her truck flipped over and she had to get in a ditch to get the horses out of the trailer.  She states that she has been trying extra strength creams and states that "nothing helps".  She has been taking benadryl like "tic tacs" and has been taking steaming hot showers for relief.    Medication: Patient went to her gynecologist recently and was asked to be put on Zoloft 50 mg and states that it is working very well for her like it did before she gave birth to her daughter.  She is asking for her prescription to be extended at this time.   Patient Active Problem List   Diagnosis Date Noted  . OCD (obsessive compulsive disorder) 10/19/2011  . Anxiety 10/19/2011   Past Medical History  Diagnosis Date  . Anxiety   . Depression   . Asthma     as a child-no inhaler   Past Surgical History  Procedure Laterality Date  . Cesarean section  07/25/2012    Procedure: CESAREAN SECTION;  Surgeon: Leslie Andrea, MD;   Location: [facility];  Service: Obstetrics;  Laterality: N/A;  . Laparoscopic tubal ligation Bilateral 10/30/2013    Procedure: LAPAROSCOPIC TUBAL LIGATION;  Surgeon: Meriel Pica, MD;  Location: [facility];  Service: Gynecology;  Laterality: Bilateral;  with Filshie Clips   No Known Allergies Prior to Admission medications   Medication Sig Start Date End Date Taking? Authorizing Provider  sertraline (ZOLOFT) 50 MG tablet Take 50 mg by mouth daily.   Yes Historical Provider, MD  Alum & Mag Hydroxide-Simeth (MAGIC MOUTHWASH W/LIDOCAINE) SOLN Take 10 mLs by mouth every 2 (two) hours as needed for mouth pain. Patient not taking: Reported on 03/02/2015 05/26/14   Chelle Jeffery, PA-C  Guaifenesin (MUCINEX MAXIMUM STRENGTH) 1200 MG TB12 Take 1 tablet (1,200 mg total) by mouth every 12 (twelve) hours as needed. Patient not taking: Reported on 03/02/2015 05/26/14   Porfirio Oar, PA-C  ipratropium (ATROVENT) 0.03 % nasal spray Place 2 sprays into both nostrils 2 (two) times daily. Patient not taking: Reported on 03/02/2015 05/26/14   Porfirio Oar, PA-C   History   Social History  . Marital Status: Married    Spouse Name: [name]  . Number of Children: 1  . Years of Education: BFA   Occupational History  . 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 Smoking status: Never Smoker   . Smokeless tobacco: Never Used  . Alcohol Use: 0.0 - 2.5 oz/week    0-5 drink(s) per week  . Drug Use: No  . Sexual Activity:    Partners: Male    Birth Control/ Protection: Pill   Other Topics Concern  . Not on file   Social History Narrative   Lives with her husband, their daughter. His son lives with them every other weekend.   She earned a BFA in sculpture (metal) from [employer].     Current Outpatient Prescriptions on File Prior to Visit  Medication Sig Dispense Refill  . Alum & Mag Hydroxide-Simeth (MAGIC MOUTHWASH W/LIDOCAINE) SOLN Take 10 mLs by mouth every 2 (two) hours as needed for  mouth pain. (Patient not taking: Reported on 03/02/2015) 360 mL 0  . Guaifenesin (MUCINEX MAXIMUM STRENGTH) 1200 MG TB12 Take 1 tablet (1,200 mg total) by mouth every 12 (twelve) hours as needed. (Patient not taking: Reported on 03/02/2015) 14 tablet 1  . ipratropium (ATROVENT) 0.03 % nasal spray Place 2 sprays into both nostrils 2 (two) times daily. (Patient not taking: Reported on 03/02/2015) 30 mL 0   No current facility-administered medications on file prior to visit.    No Known Allergies     Review of Systems  Constitutional: Negative for fever and chills.  Eyes: Negative for pain, discharge and itching.  Gastrointestinal: Positive for nausea. Negative for vomiting.  Musculoskeletal: Positive for myalgias. Negative for gait problem, neck pain and neck stiffness.  Skin: Positive for rash.  Neurological: Negative.        Objective:   Physical Exam  Constitutional: She is oriented to person, place, and time. She appears well-developed and well-nourished. No distress.  HENT:  Head: Normocephalic and atraumatic.  Eyes: Conjunctivae and EOM are normal. Pupils are equal, round, and reactive to light.  Neck: Normal range of motion. Neck supple.  Cardiovascular: Normal rate.   Pulmonary/Chest: Effort normal. No respiratory distress.  Musculoskeletal: Normal range of motion.  Neurological: She is alert and oriented to person, place, and time.  Skin: Skin is warm and dry. Rash noted.  Extensive rash of erythematous papules as discrete lesions throughout the groins and lower extremities and also involving the buttock area. A few scattered lesions on the chest and back, no lesion son the upper extremities or face. No vesiculation and no secondary infection.   Psychiatric: She has a normal mood and affect. Her behavior is normal.  Nursing note and vitals reviewed.  Filed Vitals:   03/02/15 1449  BP: 102/80  Pulse: 98  Temp: 98.7 F (37.1 C)  TempSrc: Oral  Resp: 18  Height: 5' 2.5"  (1.588 m)  Weight: 124 lb 6.4 oz (56.427 kg)  SpO2: 99%       Assessment & Plan:  I have completed the patient encounter in its entirety as documented by the scribe, with editing by me where necessary. Trena Dunavan P. Merla Riches, M.D.  Allergic reaction, initial encounter - Plan: methylPREDNISolone acetate (DEPO-MEDROL) injection 80 mg  Insect bites  Zoloft refilled-see past history Meds ordered this encounter  Medications  . sertraline (ZOLOFT) 50 MG tablet    Sig: Take 50 mg by mouth daily.  . methylPREDNISolone acetate (DEPO-MEDROL) injection 80 mg    Sig:   . predniSONE (DELTASONE) 20 MG tablet    Sig: 3/3/2/2/1/1 single daily dose for 6 days-start this on Tuesday morning    Dispense:  12 tablet  Refill:  0  . sertraline (ZOLOFT) 50 MG tablet    Sig: Take 1 tablet (50 mg total) by mouth daily.    Dispense:  90 tablet    Refill:  3

## 2015-06-07 ENCOUNTER — Encounter: Payer: Self-pay | Admitting: Family Medicine

## 2015-06-07 ENCOUNTER — Ambulatory Visit (INDEPENDENT_AMBULATORY_CARE_PROVIDER_SITE_OTHER): Payer: 59

## 2015-06-07 ENCOUNTER — Ambulatory Visit (INDEPENDENT_AMBULATORY_CARE_PROVIDER_SITE_OTHER): Payer: 59 | Admitting: Family Medicine

## 2015-06-07 VITALS — BP 122/74 | HR 102 | Temp 99.6°F | Resp 17 | Ht 62.5 in | Wt 116.0 lb

## 2015-06-07 DIAGNOSIS — R059 Cough, unspecified: Secondary | ICD-10-CM

## 2015-06-07 DIAGNOSIS — R0600 Dyspnea, unspecified: Secondary | ICD-10-CM

## 2015-06-07 DIAGNOSIS — R05 Cough: Secondary | ICD-10-CM

## 2015-06-07 DIAGNOSIS — J189 Pneumonia, unspecified organism: Secondary | ICD-10-CM | POA: Diagnosis not present

## 2015-06-07 LAB — POCT CBC
Granulocyte percent: 65.7 %G (ref 37–80)
HEMATOCRIT: 42 % (ref 37.7–47.9)
HEMOGLOBIN: 12.8 g/dL (ref 12.2–16.2)
LYMPH, POC: 1.9 (ref 0.6–3.4)
MCH, POC: 27.4 pg (ref 27–31.2)
MCHC: 30.5 g/dL — AB (ref 31.8–35.4)
MCV: 89.8 fL (ref 80–97)
MID (cbc): 1 — AB (ref 0–0.9)
MPV: 7.4 fL (ref 0–99.8)
POC GRANULOCYTE: 5.5 (ref 2–6.9)
POC LYMPH PERCENT: 22.4 %L (ref 10–50)
POC MID %: 11.9 %M (ref 0–12)
Platelet Count, POC: 221 10*3/uL (ref 142–424)
RBC: 4.67 M/uL (ref 4.04–5.48)
RDW, POC: 12.4 %
WBC: 8.4 10*3/uL (ref 4.6–10.2)

## 2015-06-07 MED ORDER — AZITHROMYCIN 250 MG PO TABS: ORAL_TABLET | ORAL | Status: DC

## 2015-06-07 NOTE — Progress Notes (Signed)
Subjective:  This chart was scribed for Meredith Staggers, MD by Stann Ore, Medical Scribe. This patient was seen in room 11 and the patient's care was started 12:39 PM.    Patient ID: [name] [name], female    DOB: 1986/04/29, 29 y.o.   MRN: [id number]  HPI [name] [name] is a 29 y.o. female Pt is here for SOB with congestion starting a week ago. She was getting worse yesterday with productive cough (yellow mucus) and was really difficult to breathe. She had asthma when she was younger, but denies using her inhaler recently. She also states that her teeth hurt and hurts to chew. Today, she has chills and has to wear a sweater.   Her 108 y.o daughter had coughs too. Her daughter was put on antibiotics for ear infection.   Patient Active Problem List   Diagnosis Date Noted  . OCD (obsessive compulsive disorder) 10/19/2011  . Anxiety 10/19/2011   Past Medical History  Diagnosis Date  . Anxiety   . Depression   . Asthma     as a child-no inhaler   Past Surgical History  Procedure Laterality Date  . Cesarean section  07/25/2012    Procedure: CESAREAN SECTION;  Surgeon: Leslie Andrea, MD;  Location: [facility];  Service: Obstetrics;  Laterality: N/A;  . Laparoscopic tubal ligation Bilateral 10/30/2013    Procedure: LAPAROSCOPIC TUBAL LIGATION;  Surgeon: Meriel Pica, MD;  Location: [facility];  Service: Gynecology;  Laterality: Bilateral;  with Filshie Clips   No Known Allergies Prior to Admission medications   Medication Sig Start Date End Date Taking? Authorizing Provider  sertraline (ZOLOFT) 50 MG tablet Take 1 tablet (50 mg total) by mouth daily. 03/02/15  Yes Tonye Pearson, MD   Social History   Social History  . Marital Status: Married    Spouse Name: [name]  . Number of Children: 1  . Years of Education: BFA   Occupational History  . 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 Smoking status: Never Smoker   . Smokeless tobacco: Never  Used  . Alcohol Use: 0.0 - 2.5 oz/week    0-5 drink(s) per week  . Drug Use: No  . Sexual Activity:    Partners: Male    Birth Control/ Protection: Pill   Other Topics Concern  . Not on file   Social History Narrative   Lives with her husband, their daughter. His son lives with them every other weekend.   She earned a BFA in sculpture (metal) from [employer].     Review of Systems  Constitutional: Positive for fever, chills and fatigue.  HENT: Positive for congestion.   Respiratory: Positive for cough and shortness of breath.        Objective:   Physical Exam  Constitutional: She is oriented to person, place, and time. She appears well-developed and well-nourished. No distress.  HENT:  Head: Normocephalic and atraumatic.  Right Ear: Hearing, tympanic membrane, external ear and ear canal normal.  Left Ear: Hearing, tympanic membrane, external ear and ear canal normal.  Nose: Nose normal.  Mouth/Throat: Oropharynx is clear and moist and mucous membranes are normal. No oropharyngeal exudate.  Maxillary sinus tender bilaterally  Eyes: Conjunctivae and EOM are normal. Pupils are equal, round, and reactive to light.  Cardiovascular: Normal rate, regular rhythm, normal heart sounds and intact distal pulses.   No murmur heard. Pulmonary/Chest: Effort normal. No respiratory distress. She has no  wheezes. She has rhonchi in the left lower field.  Neurological: She is alert and oriented to person, place, and time.  Skin: Skin is warm and dry. No rash noted.  Psychiatric: She has a normal mood and affect. Her behavior is normal.  Vitals reviewed.   Filed Vitals:   06/07/15 1212  BP: 122/74  Pulse: 102  Temp: 99.6 F (37.6 C)  Resp: 17  Height: 5' 2.5" (1.588 m)  Weight: 116 lb (52.617 kg)  SpO2: 98%   [facility] reading (PRIMARY) by Dr. Neva Seat : chest x-ray: few increased lung markings, right middle lobe greater than left lower lobe  Results for orders placed or performed in visit on  06/07/15  POCT CBC  Result Value Ref Range   WBC 8.4 4.6 - 10.2 K/uL   Lymph, poc 1.9 0.6 - 3.4   POC LYMPH PERCENT 22.4 10 - 50 %L   MID (cbc) 1.0 (A) 0 - 0.9   POC MID % 11.9 0 - 12 %M   POC Granulocyte 5.5 2 - 6.9   Granulocyte percent 65.7 37 - 80 %G   RBC 4.67 4.04 - 5.48 M/uL   Hemoglobin 12.8 12.2 - 16.2 g/dL   HCT, POC 16.1 09.6 - 47.9 %   MCV 89.8 80 - 97 fL   MCH, POC 27.4 27 - 31.2 pg   MCHC 30.5 (A) 31.8 - 35.4 g/dL   RDW, POC 04.5 %   Platelet Count, POC 221 142 - 424 K/uL   MPV 7.4 0 - 99.8 fL      Assessment & Plan:   By signing my name below, I, Stann Ore, attest that this documentation has been prepared under the direction and in the presence of Meredith Staggers, MD. Electronically Signed: Stann Ore, Scribe. 06/07/2015 , 12:39 PM . [name] [name] is a 29 y.o. female Cough - Plan: DG Chest 2 View, POCT CBC, azithromycin (ZITHROMAX) 250 MG tablet  Dyspnea - Plan: DG Chest 2 View, POCT CBC, azithromycin (ZITHROMAX) 250 MG tablet  CAP (community acquired pneumonia) - Plan: azithromycin (ZITHROMAX) 250 MG tablet  Initial viral syndrome, with secondary sickening past 2 days. Signs and chest symptoms, but chest symptoms appear to be worse, with left lower lobe coarse breath sounds versus rhonchi heard on exam. No apparent pneumonia/infiltrate on initial chest x-ray, and reassuring CBC, but suspected early community acquired pneumonia versus bronchitis. Start Z-Pak. Symptomatic care discussed. Can try 1-2 days of Afrin if needed for sinus symptoms. RTC precautions discussed over the next 2-3 days if not improving.   Meds ordered this encounter  Medications  . azithromycin (ZITHROMAX) 250 MG tablet    Sig: Take 2 pills by mouth on day 1, then 1 pill by mouth per day on days 2 through 5.    Dispense:  6 tablet    Refill:  0   Patient Instructions  Saline nasal spray atleast 4 times per day, over the counter mucinex or mucinex DM as needed for cough.  Okay  to try Afrin nasal spray over-the-counter up to 2 days if needed for pressure in the head.   Do not use for more than 2 days straight. Drink plenty of fluids.   Start antibiotic for possible early pneumonia, follow-up with me in the next 48 hours if you have not starting to improve, sooner or to emergency room if worse including if any trouble breathing tonight.   Return to the clinic or go to the nearest emergency room if any  of your symptoms worsen or new symptoms occur.  Pneumonia Pneumonia is an infection of the lungs.  CAUSES Pneumonia may be caused by bacteria or a virus. Usually, these infections are caused by breathing infectious particles into the lungs (respiratory tract). SIGNS AND SYMPTOMS   Cough.  Fever.  Chest pain.  Increased rate of breathing.  Wheezing.  Mucus production. DIAGNOSIS  If you have the common symptoms of pneumonia, your health care provider will typically confirm the diagnosis with a chest X-ray. The X-ray will show an abnormality in the lung (pulmonary infiltrate) if you have pneumonia. Other tests of your blood, urine, or sputum may be done to find the specific cause of your pneumonia. Your health care provider may also do tests (blood gases or pulse oximetry) to see how well your lungs are working. TREATMENT  Some forms of pneumonia may be spread to other people when you cough or sneeze. You may be asked to wear a mask before and during your exam. Pneumonia that is caused by bacteria is treated with antibiotic medicine. Pneumonia that is caused by the influenza virus may be treated with an antiviral medicine. Most other viral infections must run their course. These infections will not respond to antibiotics.  HOME CARE INSTRUCTIONS   Cough suppressants may be used if you are losing too much rest. However, coughing protects you by clearing your lungs. You should avoid using cough suppressants if you can.  Your health care provider may have prescribed  medicine if he or she thinks your pneumonia is caused by bacteria or influenza. Finish your medicine even if you start to feel better.  Your health care provider may also prescribe an expectorant. This loosens the mucus to be coughed up.  Take medicines only as directed by your health care provider.  Do not smoke. Smoking is a common cause of bronchitis and can contribute to pneumonia. If you are a smoker and continue to smoke, your cough may last several weeks after your pneumonia has cleared.  A cold steam vaporizer or humidifier in your room or home may help loosen mucus.  Coughing is often worse at night. Sleeping in a semi-upright position in a recliner or using a couple pillows under your head will help with this.  Get rest as you feel it is needed. Your body will usually let you know when you need to rest. PREVENTION A pneumococcal shot (vaccine) is available to prevent a common bacterial cause of pneumonia. This is usually suggested for:  People over 61 years old.  Patients on chemotherapy.  People with chronic lung problems, such as bronchitis or emphysema.  People with immune system problems. If you are over 65 or have a high risk condition, you may receive the pneumococcal vaccine if you have not received it before. In some countries, a routine influenza vaccine is also recommended. This vaccine can help prevent some cases of pneumonia.You may be offered the influenza vaccine as part of your care. If you smoke, it is time to quit. You may receive instructions on how to stop smoking. Your health care provider can provide medicines and counseling to help you quit. SEEK MEDICAL CARE IF: You have a fever. SEEK IMMEDIATE MEDICAL CARE IF:   Your illness becomes worse. This is especially true if you are elderly or weakened from any other disease.  You cannot control your cough with suppressants and are losing sleep.  You begin coughing up blood.  You develop pain which is  getting worse  or is uncontrolled with medicines.  Any of the symptoms which initially brought you in for treatment are getting worse rather than better.  You develop shortness of breath or chest pain. MAKE SURE YOU:   Understand these instructions.  Will watch your condition.  Will get help right away if you are not doing well or get worse. Document Released: 08/29/2005 Document Revised: 01/13/2014 Document Reviewed: 11/18/2010 [facility] Patient Information 2015 [place], [place]. This information is not intended to replace advice given to you by your health care provider. Make sure you discuss any questions you have with your health care provider.     I personally performed the services described in this documentation, which was scribed in my presence. The recorded information has been reviewed and considered, and addended by me as needed.

## 2015-06-07 NOTE — Patient Instructions (Addendum)
Saline nasal spray atleast 4 times per day, over the counter mucinex or mucinex DM as needed for cough.  Okay to try Afrin nasal spray over-the-counter up to 2 days if needed for pressure in the head.   Do not use for more than 2 days straight. Drink plenty of fluids.   Start antibiotic for possible early pneumonia, follow-up with me in the next 48 hours if you have not starting to improve, sooner or to emergency room if worse including if any trouble breathing tonight.   Return to the clinic or go to the nearest emergency room if any of your symptoms worsen or new symptoms occur.  Pneumonia Pneumonia is an infection of the lungs.  CAUSES Pneumonia may be caused by bacteria or a virus. Usually, these infections are caused by breathing infectious particles into the lungs (respiratory tract). SIGNS AND SYMPTOMS   Cough.  Fever.  Chest pain.  Increased rate of breathing.  Wheezing.  Mucus production. DIAGNOSIS  If you have the common symptoms of pneumonia, your health care provider will typically confirm the diagnosis with a chest X-ray. The X-ray will show an abnormality in the lung (pulmonary infiltrate) if you have pneumonia. Other tests of your blood, urine, or sputum may be done to find the specific cause of your pneumonia. Your health care provider may also do tests (blood gases or pulse oximetry) to see how well your lungs are working. TREATMENT  Some forms of pneumonia may be spread to other people when you cough or sneeze. You may be asked to wear a mask before and during your exam. Pneumonia that is caused by bacteria is treated with antibiotic medicine. Pneumonia that is caused by the influenza virus may be treated with an antiviral medicine. Most other viral infections must run their course. These infections will not respond to antibiotics.  HOME CARE INSTRUCTIONS   Cough suppressants may be used if you are losing too much rest. However, coughing protects you by clearing your  lungs. You should avoid using cough suppressants if you can.  Your health care provider may have prescribed medicine if he or she thinks your pneumonia is caused by bacteria or influenza. Finish your medicine even if you start to feel better.  Your health care provider may also prescribe an expectorant. This loosens the mucus to be coughed up.  Take medicines only as directed by your health care provider.  Do not smoke. Smoking is a common cause of bronchitis and can contribute to pneumonia. If you are a smoker and continue to smoke, your cough may last several weeks after your pneumonia has cleared.  A cold steam vaporizer or humidifier in your room or home may help loosen mucus.  Coughing is often worse at night. Sleeping in a semi-upright position in a recliner or using a couple pillows under your head will help with this.  Get rest as you feel it is needed. Your body will usually let you know when you need to rest. PREVENTION A pneumococcal shot (vaccine) is available to prevent a common bacterial cause of pneumonia. This is usually suggested for:  People over 24 years old.  Patients on chemotherapy.  People with chronic lung problems, such as bronchitis or emphysema.  People with immune system problems. If you are over 65 or have a high risk condition, you may receive the pneumococcal vaccine if you have not received it before. In some countries, a routine influenza vaccine is also recommended. This vaccine can help prevent some  cases of pneumonia.You may be offered the influenza vaccine as part of your care. If you smoke, it is time to quit. You may receive instructions on how to stop smoking. Your health care provider can provide medicines and counseling to help you quit. SEEK MEDICAL CARE IF: You have a fever. SEEK IMMEDIATE MEDICAL CARE IF:   Your illness becomes worse. This is especially true if you are elderly or weakened from any other disease.  You cannot control your  cough with suppressants and are losing sleep.  You begin coughing up blood.  You develop pain which is getting worse or is uncontrolled with medicines.  Any of the symptoms which initially brought you in for treatment are getting worse rather than better.  You develop shortness of breath or chest pain. MAKE SURE YOU:   Understand these instructions.  Will watch your condition.  Will get help right away if you are not doing well or get worse. Document Released: 08/29/2005 Document Revised: 01/13/2014 Document Reviewed: 11/18/2010 [facility] Patient Information 2015 [place], [place]. This information is not intended to replace advice given to you by your health care provider. Make sure you discuss any questions you have with your health care provider.

## 2015-08-10 ENCOUNTER — Encounter: Payer: Self-pay | Admitting: Internal Medicine

## 2015-11-23 ENCOUNTER — Ambulatory Visit (INDEPENDENT_AMBULATORY_CARE_PROVIDER_SITE_OTHER): Payer: 59 | Admitting: Urgent Care

## 2015-11-23 VITALS — BP 120/72 | HR 76 | Temp 98.2°F | Resp 16 | Ht 62.21 in | Wt 124.4 lb

## 2015-11-23 DIAGNOSIS — K0889 Other specified disorders of teeth and supporting structures: Secondary | ICD-10-CM | POA: Diagnosis not present

## 2015-11-23 DIAGNOSIS — H65192 Other acute nonsuppurative otitis media, left ear: Secondary | ICD-10-CM | POA: Diagnosis not present

## 2015-11-23 MED ORDER — AMOXICILLIN-POT CLAVULANATE 875-125 MG PO TABS: 1.0000 | ORAL_TABLET | Freq: Two times a day (BID) | ORAL | Status: DC

## 2015-11-23 NOTE — Patient Instructions (Addendum)
     IF you received an x-ray today, you will receive an invoice from [facility]. Please contact [facility] at [phone] with questions or concerns regarding your invoice.   IF you received labwork today, you will receive an invoice from [facility]. Please contact [facility] at [phone] with questions or concerns regarding your invoice.   Our billing staff will not be able to assist you with questions regarding bills from these companies.  You will be contacted with the lab results as soon as they are available. The fastest way to get your results is to activate your My Chart account. Instructions are located on the last page of this paperwork. If you have not heard from us regarding the results in 2 weeks, please contact this office.     Otitis Media, Adult Otitis media is redness, soreness, and inflammation of the middle ear. Otitis media may be caused by allergies or, most commonly, by infection. Often it occurs as a complication of the common cold. SIGNS AND SYMPTOMS Symptoms of otitis media may include:  Earache.  Fever.  Ringing in your ear.  Headache.  Leakage of fluid from the ear. DIAGNOSIS To diagnose otitis media, your health care provider will examine your ear with an otoscope. This is an instrument that allows your health care provider to see into your ear in order to examine your eardrum. Your health care provider also will ask you questions about your symptoms. TREATMENT  Typically, otitis media resolves on its own within 3-5 days. Your health care provider may prescribe medicine to ease your symptoms of pain. If otitis media does not resolve within 5 days or is recurrent, your health care provider may prescribe antibiotic medicines if he or she suspects that a bacterial infection is the cause. HOME CARE INSTRUCTIONS   If you were prescribed an antibiotic medicine, finish it all even if you start to feel  better.  Take medicines only as directed by your health care provider.  Keep all follow-up visits as directed by your health care provider. SEEK MEDICAL CARE IF:  You have otitis media only in one ear, or bleeding from your nose, or both.  You notice a lump on your neck.  You are not getting better in 3-5 days.  You feel worse instead of better. SEEK IMMEDIATE MEDICAL CARE IF:   You have pain that is not controlled with medicine.  You have swelling, redness, or pain around your ear or stiffness in your neck.  You notice that part of your face is paralyzed.  You notice that the bone behind your ear (mastoid) is tender when you touch it. MAKE SURE YOU:   Understand these instructions.  Will watch your condition.  Will get help right away if you are not doing well or get worse.   This information is not intended to replace advice given to you by your health care provider. Make sure you discuss any questions you have with your health care provider.   Document Released: 06/03/2004 Document Revised: 09/19/2014 Document Reviewed: 03/26/2013 Elsevier Interactive Patient Education 2016 Elsevier Inc.  

## 2015-11-23 NOTE — Progress Notes (Signed)
    MRN: [id number] DOB: [birth year]  Subjective:   [name] [name] is a 30 y.o. female presenting for chief complaint of Ear Pain; Headache; Generalized Body Aches; and Jaw Pain  Reports 3 day history of intermittent body aches, chills, lymph node pain, nausea. This morning patient started having severe left ear pain, left ear tinnitus, left sided facial pain, tooth pain. Tried an otc ear drop with minimal relief. Denies fever, ear drainage. Patient is also concerned about her wisdom teeth. She has been told that they probably need to be taken out but she is worried that any time she gets sick, they start to hurt and wants to make sure they are not infected.  [name] [name] currently has no medications in their medication list. Also has No Known Allergies.  [name] [name]  has a past medical history of Anxiety; Depression; and Asthma. Also  has past surgical history that includes Cesarean section (07/25/2012) and Laparoscopic tubal ligation (Bilateral, 10/30/2013).  Objective:   Vitals: BP 120/72 mmHg  Pulse 76  Temp(Src) 98.2 F (36.8 C)  Resp 16  Ht 5' 2.21" (1.58 m)  Wt 124 lb 6.4 oz (56.427 kg)  BMI 22.60 kg/m2  SpO2 99%  Physical Exam  Constitutional: She is oriented to person, place, and time. She appears well-developed and well-nourished.  HENT:  Left TM erythematous, bulging but intact, tragus is tender. No ear drainage. Right TM is normal. Nasal turbinates pink and moist, nasal passages patent. No sinus tenderness. Oropharynx clear, mucous membranes moist, dentition in good repair, gingiva without erythema or edema.  Eyes: Right eye exhibits no discharge. Left eye exhibits no discharge. No scleral icterus.  Neck: Normal range of motion. Neck supple.  Cardiovascular: Normal rate.   Pulmonary/Chest: Effort normal.  Lymphadenopathy:    She has no cervical adenopathy.  Neurological: She is alert and oriented to person, place, and time.  Skin: Skin is warm and dry.   Assessment and Plan  :   1. Acute nonsuppurative otitis media of left ear - Start Augmentin, rtc in 3-4 days if no improvement. Use ibuprofen or Tylenol for pain and inflammation.  2. Tooth pain - Counseled on need for evaluation by dentist and/or oral surgeon. Her wisdom teeth are erupting but do not appear infected.  Wallis BambergMario Autrey Human, PA-C [facility] [phone] 11/23/2015 9:34 AM

## 2016-01-29 ENCOUNTER — Ambulatory Visit (INDEPENDENT_AMBULATORY_CARE_PROVIDER_SITE_OTHER): Payer: 59 | Admitting: Family Medicine

## 2016-01-29 VITALS — BP 116/70 | HR 80 | Temp 98.5°F | Resp 18 | Ht 62.21 in | Wt 127.0 lb

## 2016-01-29 DIAGNOSIS — R300 Dysuria: Secondary | ICD-10-CM

## 2016-01-29 DIAGNOSIS — R350 Frequency of micturition: Secondary | ICD-10-CM

## 2016-01-29 LAB — POC MICROSCOPIC URINALYSIS (UMFC): Mucus: ABSENT

## 2016-01-29 MED ORDER — CIPROFLOXACIN HCL 250 MG PO TABS: 250.0000 mg | ORAL_TABLET | Freq: Two times a day (BID) | ORAL | Status: DC

## 2016-01-29 NOTE — Patient Instructions (Addendum)

## 2016-01-29 NOTE — Progress Notes (Signed)
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 She developed burning on urination this morning. It has become progressively worse and she's become quite uncomfortable. She's had a UTI in the past and this is consistent with the symptoms she's having now.  She's had no nausea or vomiting, no fever, no flank pain  She's also had some upper respiratory symptoms that it: For several days but they are beginning to subside. She's had a runny nose and some aches.   Objective:  BP 116/70 mmHg  Pulse 80  Temp(Src) 98.5 F (36.9 C) (Oral)  Resp 18  Ht 5' 2.21" (1.58 m)  Wt 127 lb (57.607 kg)  BMI 23.08 kg/m2  SpO2 98%  LMP 01/11/2016  Results for orders placed or performed in visit on 01/29/16  POCT Microscopic Urinalysis ([facility])  Result Value Ref Range   WBC,UR,HPF,POC Few (A) None WBC/hpf   RBC,UR,HPF,POC Moderate (A) None RBC/hpf   Bacteria Few (A) None, Too numerous to count   Mucus Absent Absent   Epithelial Cells, UR Per Microscopy Few (A) None, Too numerous to count cells/hpf   HEENT: Unremarkable No CVA tenderness  Assessment: URI and UTI plan:Urinary frequency - Plan: POCT Microscopic Urinalysis ([facility]), POCT urinalysis dipstick, ciprofloxacin (CIPRO) 250 MG tablet  Dysuria - Plan: ciprofloxacin (CIPRO) 250 MG tablet  Elvina SidleKurt Newell Wafer M.D.

## 2016-07-26 ENCOUNTER — Ambulatory Visit (INDEPENDENT_AMBULATORY_CARE_PROVIDER_SITE_OTHER): Payer: 59 | Admitting: Physician Assistant

## 2016-07-26 VITALS — BP 112/70 | HR 88 | Temp 98.7°F | Ht 62.21 in | Wt 126.0 lb

## 2016-07-26 DIAGNOSIS — H66005 Acute suppurative otitis media without spontaneous rupture of ear drum, recurrent, left ear: Secondary | ICD-10-CM

## 2016-07-26 DIAGNOSIS — H9202 Otalgia, left ear: Secondary | ICD-10-CM | POA: Diagnosis not present

## 2016-07-26 MED ORDER — KETOROLAC TROMETHAMINE 60 MG/2ML IM SOLN
60.0000 mg | Freq: Once | INTRAMUSCULAR | Status: AC
  Administered 2016-07-26: 60 mg via INTRAMUSCULAR

## 2016-07-26 MED ORDER — AMOXICILLIN-POT CLAVULANATE 875-125 MG PO TABS: 1.0000 | ORAL_TABLET | Freq: Two times a day (BID) | ORAL | 0 refills | Status: DC

## 2016-07-26 NOTE — Patient Instructions (Addendum)
If you get a cold in the future, start Zyrtec-D 5/120 twice daily at the onset of sore throat with stuffy nose.   For acute ear pain 800 mg of Iburprofen with be superior to narcotics.  It is okay to take 1000 mg of tylenol 3 times daily with ibuorofen     IF you received an x-ray today, you will receive an invoice from [facility]. Please contact [facility] at [phone] with questions or concerns regarding your invoice.   IF you received labwork today, you will receive an invoice from [facility]. Please contact [facility] at [phone] with questions or concerns regarding your invoice.   Our billing staff will not be able to assist you with questions regarding bills from these companies.  You will be contacted with the lab results as soon as they are available. The fastest way to get your results is to activate your My Chart account. Instructions are located on the last page of this paperwork. If you have not heard from us regarding the results in 2 weeks, please contact this office.

## 2016-07-26 NOTE — Progress Notes (Signed)
   07/27/2016 8:14 AM   DOB: 1986-02-11 / MRN: [id number]  SUBJECTIVE:  [name] [name] is a 30 y.o. female presenting for left ear pain that started the morning.  Associates a decrease in hearing of the left sided. These symptoms started after a cold.  She tells me she took one half of a percocet and this has helped with the pain. She had an ear infection diagnosed here about 6 months ago that also started after a cold.     She has No Known Allergies.   She  has a past medical history of Anxiety; Asthma; and Depression.    She  reports that she has never smoked. She has never used smokeless tobacco. She reports that she drinks alcohol. She reports that she does not use drugs. She  reports that she currently engages in sexual activity and has had female partners. She reports using the following method of birth control/protection: Pill. The patient  has a past surgical history that includes Cesarean section (07/25/2012) and Laparoscopic tubal ligation (Bilateral, 10/30/2013).  Her family history includes Cancer in her maternal grandmother; Depression in her father; Diabetes in her paternal grandmother; Hypertension in her father.  Review of Systems  Constitutional: Negative for chills and fever.  Respiratory: Positive for cough.   Skin: Negative for rash.    The problem list and medications were reviewed and updated by myself where necessary and exist elsewhere in the encounter.   OBJECTIVE:  BP 112/70 (BP Location: Right Arm, Patient Position: Sitting, Cuff Size: Small)   Pulse 88   Temp 98.7 F (37.1 C) (Oral)   Ht 5' 2.21" (1.58 m)   Wt 126 lb (57.2 kg)   LMP 07/01/2016   SpO2 99%   BMI 22.89 kg/m   Physical Exam  Constitutional: She is oriented to person, place, and time.  HENT:  Right Ear: Tympanic membrane normal.  Left Ear: Tympanic membrane is injected, erythematous and bulging. No hemotympanum. Decreased hearing is noted.  Cardiovascular: Normal rate and regular rhythm.    Pulmonary/Chest: Effort normal and breath sounds normal.  Musculoskeletal: Normal range of motion.  Neurological: She is alert and oriented to person, place, and time.    No results found for this or any previous visit (from the past 72 hour(s)).  No results found.  ASSESSMENT AND PLAN  [name] [name] was seen today for otalgia.  Diagnoses and all orders for this visit:  Left ear pain -     ketorolac (TORADOL) injection 60 mg; Inject 2 mLs (60 mg total) into the muscle once.  Recurrent acute suppurative otitis media without spontaneous rupture of left tympanic membrane Comments: Patient with recurrence of otitis media diagnosed earlier in the year.  Stepping up therapy to augmentin.  Orders: -     amoxicillin-clavulanate (AUGMENTIN) 875-125 MG tablet; Take 1 tablet by mouth 2 (two) times daily.    The patient is advised to call or return to clinic if she does not see an improvement in symptoms, or to seek the care of the closest emergency department if she worsens with the above plan.   Deliah BostonMichael Clark, MHS, PA-C [facility] 07/27/2016 8:14 AM

## 2017-05-09 IMAGING — CR DG CHEST 2V
2 series · 2 of 2 positions shown · non-contrast
Comparison: None.

CLINICAL DATA: Chest pain, cough, shortness of breath.

EXAM:
CHEST  2 VIEW

[PA]
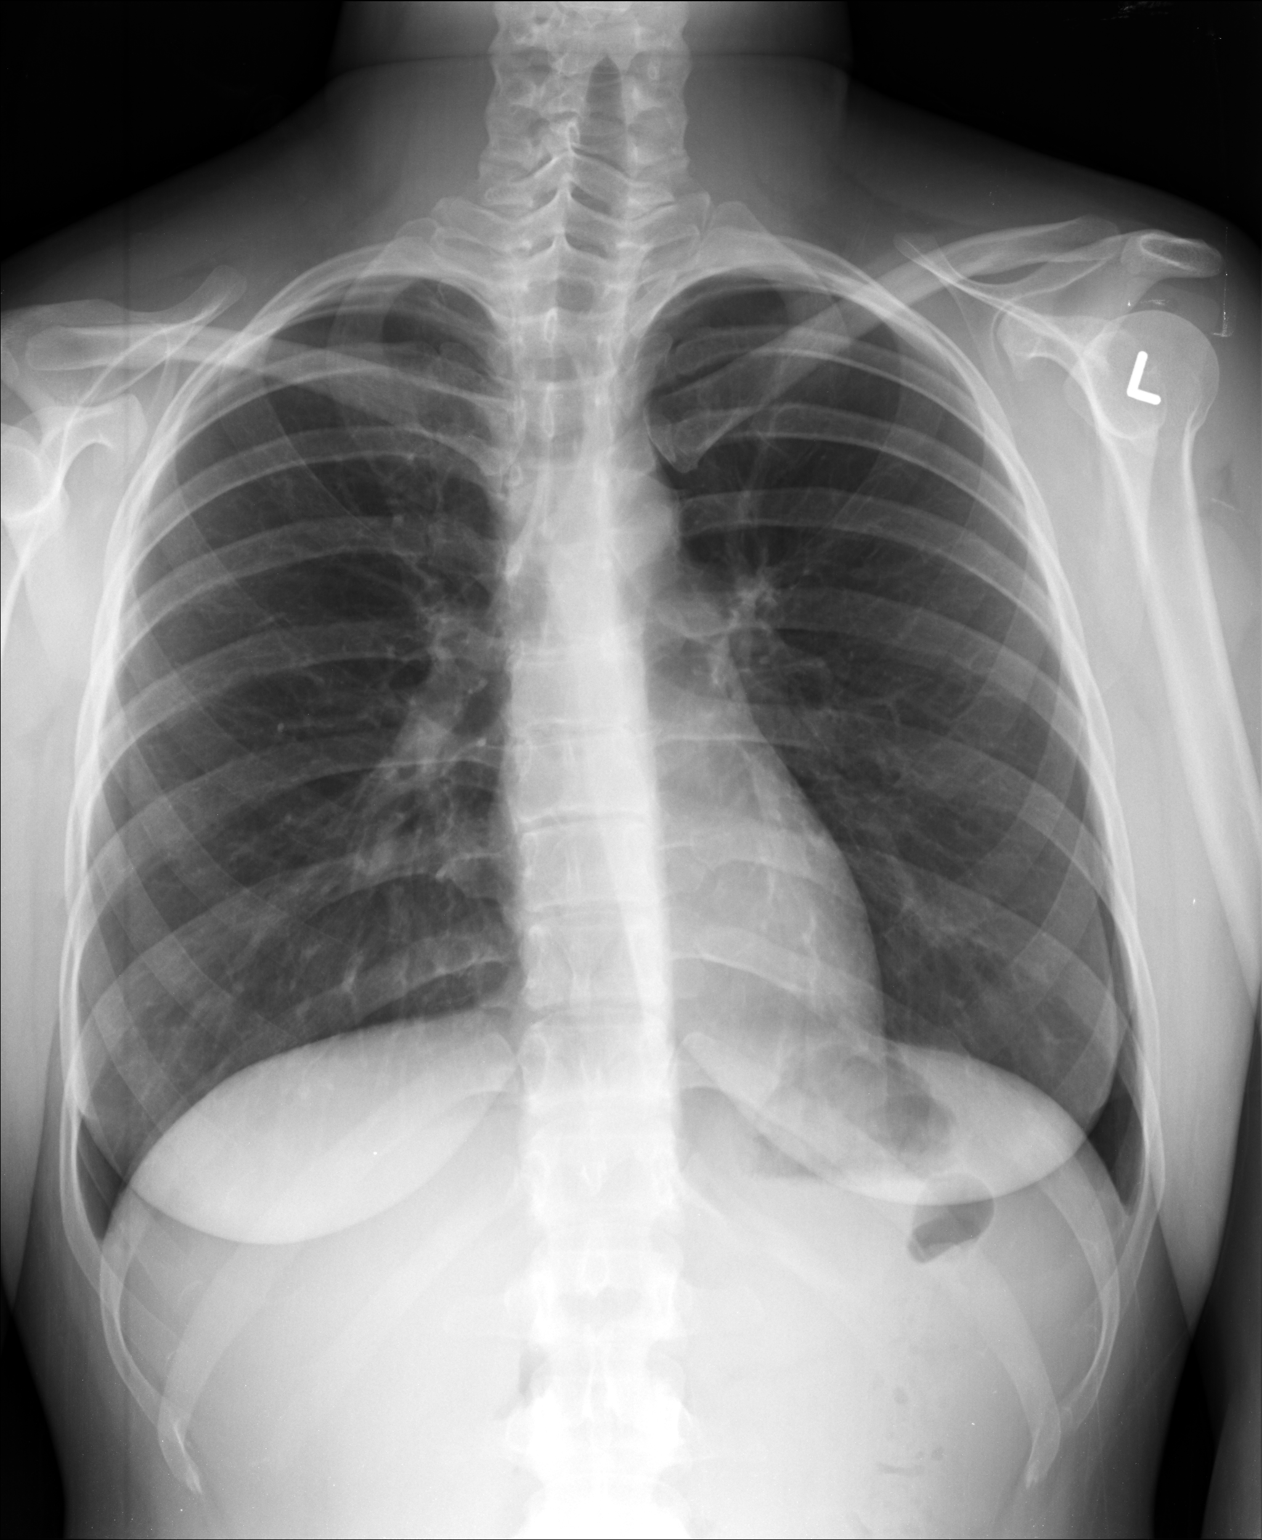

[lateral]
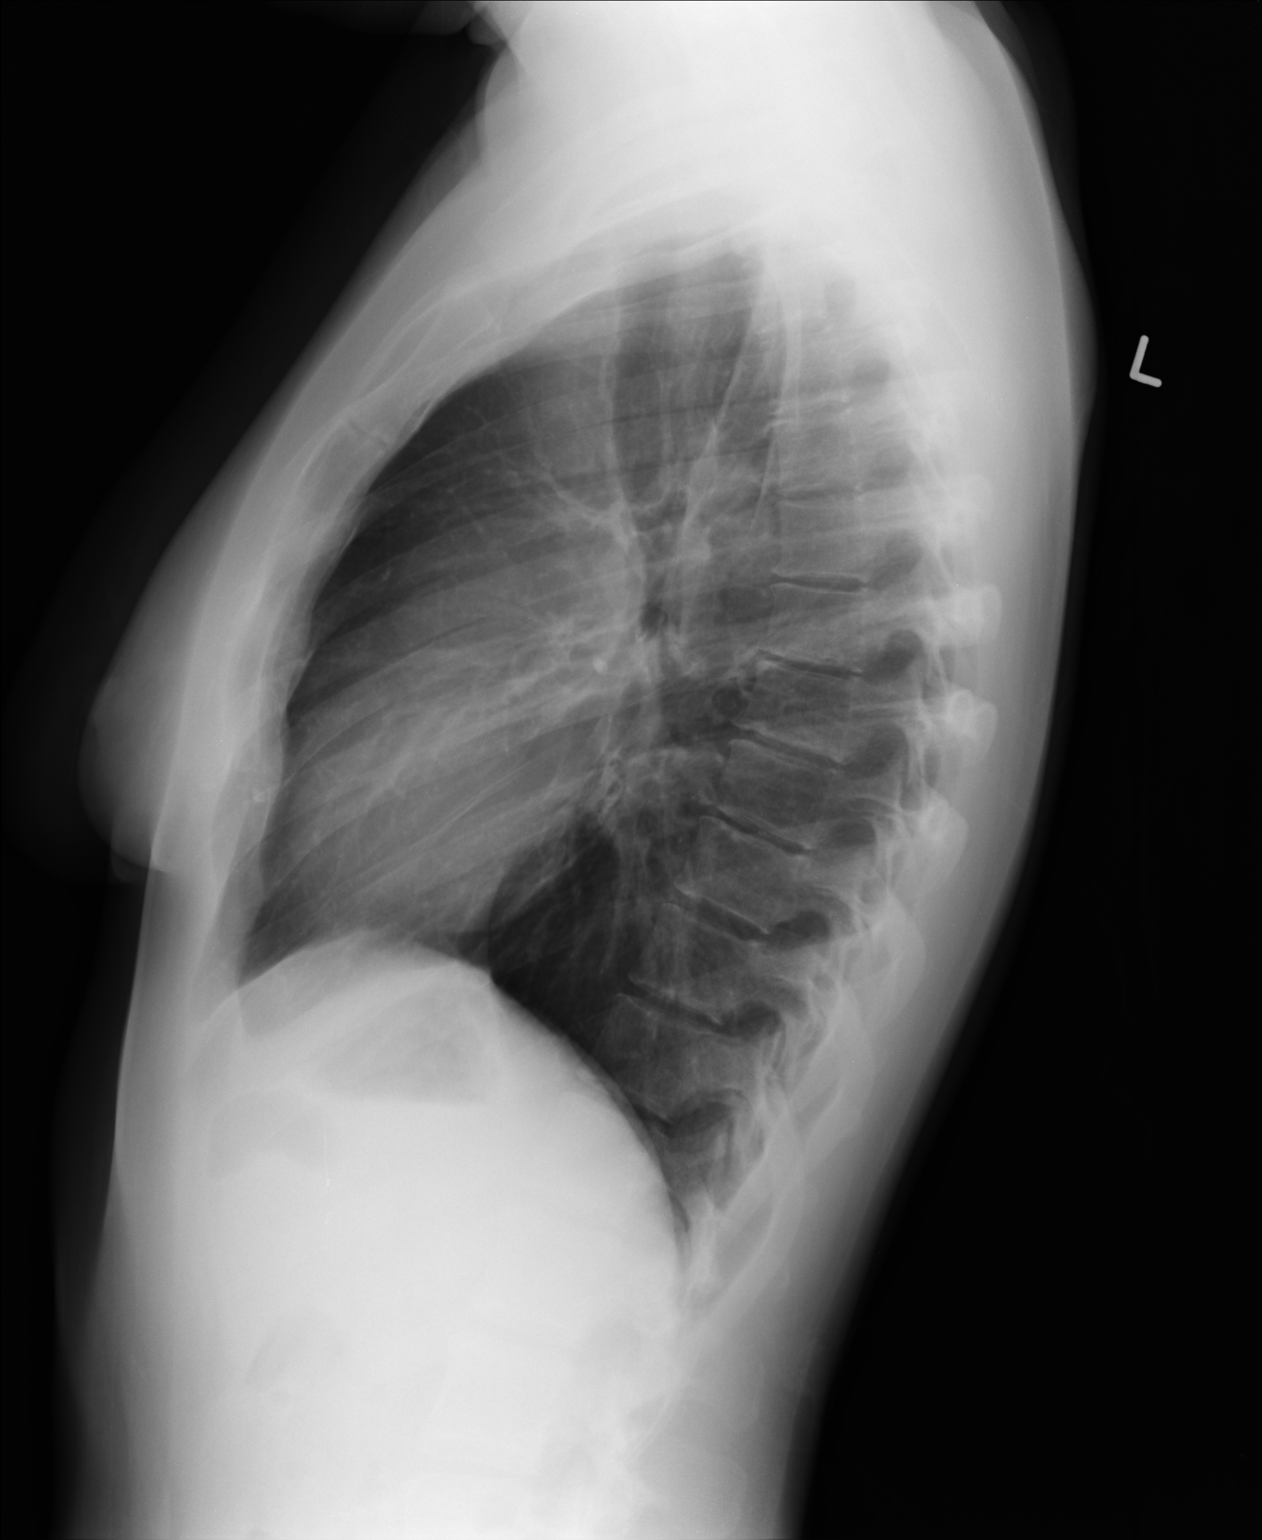

[2 of 2 positions shown; findings below may reference images not displayed]

FINDINGS: The heart size and mediastinal contours are within normal limits.
Both lungs are clear. The visualized skeletal structures are
unremarkable.
IMPRESSION: No active cardiopulmonary disease.

## 2018-05-25 LAB — HM PAP SMEAR: HM Pap smear: NEGATIVE

## 2018-07-31 ENCOUNTER — Telehealth: Payer: Self-pay

## 2018-07-31 NOTE — Telephone Encounter (Signed)
Fax from after hours - [facility].  Pt called in c/o swollen lymph noes and white substance in mouth like previous strep infections.  C/b to pt 11/19 1040.  Pt states she does not feel that she has strep.  Refuses appt at this time.  Encouraged to c/b if she has any further needs.

## 2018-10-30 ENCOUNTER — Ambulatory Visit: Payer: 59 | Admitting: Family Medicine

## 2018-10-30 ENCOUNTER — Encounter: Payer: Self-pay | Admitting: Family Medicine

## 2018-10-30 VITALS — BP 98/72 | HR 72 | Temp 98.6°F | Ht 62.25 in | Wt 125.8 lb

## 2018-10-30 DIAGNOSIS — M25562 Pain in left knee: Secondary | ICD-10-CM | POA: Insufficient documentation

## 2018-10-30 DIAGNOSIS — Z789 Other specified health status: Secondary | ICD-10-CM

## 2018-10-30 DIAGNOSIS — R5383 Other fatigue: Secondary | ICD-10-CM | POA: Diagnosis not present

## 2018-10-30 NOTE — Patient Instructions (Addendum)
Cardio exercise - 30 minutes a day, 5 times a week --> helps with anxiety/depression -- helps with energy/fatigue    #Knee Pain - try doing the exercises (other sheet) - can also try wearing a compression sleeve which you can get from the pharmacy - if not improving let me know and we can do a sports medicine referral or a PT referral  #Will check labs today   I would encourage abstaining from alcohol given your use challenges

## 2018-10-30 NOTE — Assessment & Plan Note (Signed)
Not sure what is causing her pain. No obvious baker's cyst present on exam. Given some hamstring exercises given location. Declined PT at this time. If no improvement will likely consider sports medicine evaluation.

## 2018-10-30 NOTE — Progress Notes (Signed)
Subjective:     [name] [name] is a 33 y.o. female presenting for Establish Care (previous PCP with [place] office. [facility] Abdominal Pain (had an issue with pain in the LLQ around 10/09/2018 and was evaluated by GYN and had pelvic exam, labs, US done and patient was told that this was not due to reproductive issue but did have distended colon. Pain resolved.); Wanted to discuss plant base diet that she is on now.; and Knee Pain (left knee. No injury or trauma to the knee that patient recalls. Started to bother patient around November 2019. Off and on)     HPI   #Left Knee pain - Started in November 2019 - on and off pain - no known injury - a year ago bought her car and now has a car with a clutch - worse with nothing - Better with rest - has not tried medication or exercises - location: posterior knee - Radiates: no - no buckling - some popping  #fatigue - family is concerned that she is not getting nutrients - occasionally does get into eating more junk - mostly does do well with eating - family keeps telling her to eat some meat - hard to think of words sometimes - plant based diet since 2018  - is eating some fish occasionally, but no other meat  #Alcohol use - had a hard time with binging  - cannot moderate at all - wants to just unwind but typically ends up getting drunk - if having just 2 drinks had to only have that available - was everyday, but now it is less often - does OK with avoiding drinking completely  Review of Systems See HPI  Social History   Tobacco Use  Smoking Status Never Smoker  Smokeless Tobacco Never Used        Objective:    BP Readings from Last 3 Encounters:  10/30/18 98/72  07/26/16 112/70  01/29/16 116/70   Wt Readings from Last 3 Encounters:  10/30/18 125 lb 12 oz (57 kg)  07/26/16 126 lb (57.2 kg)  01/29/16 127 lb (57.6 kg)    BP 98/72   Pulse 72   Temp 98.6 F (37 C)   Ht 5'  2.25" (1.581 m)   Wt 125 lb 12 oz (57 kg)   LMP 10/22/2018   SpO2 98%   BMI 22.82 kg/m    Physical Exam Constitutional:      General: She is not in acute distress.    Appearance: She is well-developed. She is not diaphoretic.  HENT:     Right Ear: External ear normal.     Left Ear: External ear normal.     Nose: Nose normal.  Eyes:     Conjunctiva/sclera: Conjunctivae normal.  Neck:     Musculoskeletal: Neck supple.  Cardiovascular:     Rate and Rhythm: Normal rate and regular rhythm.     Heart sounds: No murmur.  Pulmonary:     Effort: Pulmonary effort is normal. No respiratory distress.     Breath sounds: Normal breath sounds. No wheezing or rales.  Musculoskeletal:     Comments: Left Knee:  Inspection: No swelling, no erythema, no clear mass or fullness compared to the right in the posterior knee Palpation: TTP along the lower posterior knee, no anterior TTP ROM: normal w/o pain Strength: normal Ligaments normal  Skin:    General: Skin is warm and dry.     Capillary Refill:  Capillary refill takes less than 2 seconds.  Neurological:     Mental Status: She is alert. Mental status is at baseline.  Psychiatric:        Mood and Affect: Mood normal.        Behavior: Behavior normal.           Assessment & Plan:   Problem List Items Addressed This Visit      Other   Other fatigue - Primary    Possible fatigue or memory issues in setting of mostly vegan diet. Will get labs to screen for deficiencies      Relevant Orders   CBC   Ferritin   Vitamin D, 25-hydroxy   Posterior knee pain, left    Not sure what is causing her pain. No obvious baker's cyst present on exam. Given some hamstring exercises given location. Declined PT at this time. If no improvement will likely consider sports medicine evaluation.       Vegan diet       Return if symptoms worsen or fail to improve.  Lynnda Child, MD

## 2018-10-30 NOTE — Assessment & Plan Note (Signed)
Possible fatigue or memory issues in setting of mostly vegan diet. Will get labs to screen for deficiencies

## 2018-10-31 ENCOUNTER — Telehealth: Payer: Self-pay | Admitting: Family Medicine

## 2018-10-31 LAB — CBC
HCT: 35.7 % — ABNORMAL LOW (ref 36.0–46.0)
HEMOGLOBIN: 12.1 g/dL (ref 12.0–15.0)
MCHC: 33.7 g/dL (ref 30.0–36.0)
MCV: 93.3 fl (ref 78.0–100.0)
PLATELETS: 216 10*3/uL (ref 150.0–400.0)
RBC: 3.83 Mil/uL — ABNORMAL LOW (ref 3.87–5.11)
RDW: 12.1 % (ref 11.5–15.5)
WBC: 6.1 10*3/uL (ref 4.0–10.5)

## 2018-10-31 LAB — FERRITIN: Ferritin: 39.8 ng/mL (ref 10.0–291.0)

## 2018-10-31 LAB — VITAMIN D 25 HYDROXY (VIT D DEFICIENCY, FRACTURES): VITD: 25.07 ng/mL — ABNORMAL LOW (ref 30.00–100.00)

## 2018-10-31 NOTE — Telephone Encounter (Signed)
Called to discuss lab results.   Borderline anemia, but normal iron.   Low vitamin D. Discussed option of high dose replacement vs daily supplement and she elected to do daily supplement.   Advised [phone] mg of Vit D supplement daily  Lynnda Child

## 2019-05-07 ENCOUNTER — Encounter: Payer: Self-pay | Admitting: Primary Care

## 2019-05-07 ENCOUNTER — Telehealth (INDEPENDENT_AMBULATORY_CARE_PROVIDER_SITE_OTHER): Payer: 59 | Admitting: Primary Care

## 2019-05-07 DIAGNOSIS — F411 Generalized anxiety disorder: Secondary | ICD-10-CM

## 2019-05-07 MED ORDER — ESCITALOPRAM OXALATE 5 MG PO TABS
5.0000 mg | ORAL_TABLET | Freq: Every day | ORAL | 1 refills | Status: DC
Start: 2019-05-07 — End: 2019-05-24

## 2019-05-07 NOTE — Assessment & Plan Note (Signed)
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 Prior history of anxiety, seems like she may have been on Zoloft postpartum as it's considered safe with breast feeding. Long discussion today regarding treatment including therapy and medication, she declines therapy and opts for medication.  Rx for Lexapro 5 mg sent to pharmacy. Patient is to take 1/2 tablet daily for 8 days, then advance to 1 full tablet thereafter. We discussed possible side effects of headache, GI upset, drowsiness, and SI/HI. If thoughts of SI/HI develop, we discussed to present to the emergency immediately. Patient verbalized understanding.   Follow up in 6 weeks for re-evaluation.

## 2019-05-07 NOTE — Progress Notes (Signed)
Subjective:    Patient ID: [name] [name], female    DOB: [birth year], 33 y.o.   MRN: [id number]  HPI  Virtual Visit via Video Note  I connected with [name] [name] on 05/07/19 at  9:40 AM EDT by a video enabled telemedicine application and verified that I am speaking with the correct person using two identifiers.  Location: Patient: Home Provider: Office   I discussed the limitations of evaluation and management by telemedicine and the availability of in person appointments. The patient expressed understanding and agreed to proceed.  History of Present Illness:  Ms. [name] [name] is a 33 year old female patient of Dr. Selena Battenody with a history of anxiety and OCD who presents today with a chief complaint of  She was once managed on a daily medication and PRN Xanax in the past, doesn't remember the daily medication. After postpartum in 2013 she attempted to go back on the same medication but found that it didn't seem to have the same effect so she started to use non pharmacological methods for anxiety treatment. This included meditation, changing her diet to a Vegan diet, self calming techniques, etc.   Symptoms include feeling anxious, easily irritable, daily worry, negative thoughts, mind racing thoughts, nightmares. Her symptoms are worse at night when trying to go to sleep. She was using alcohol to help with anxiety and symptoms but didn't find this to work, has been sober for 2 months now.    Observations/Objective:  Alert and oriented. Appears well, not sickly. No distress. Speaking in complete sentences.   Assessment and Plan:  Symptoms characteristic of GAD with GAD 7 score of 16 today. Prior history of anxiety, seems like she may have been on Zoloft postpartum as it's considered safe with breast feeding. Long discussion today regarding treatment including therapy and medication, she declines therapy and opts for medication.  Rx for Lexapro 5 mg sent to pharmacy. Patient  is to take 1/2 tablet daily for 8 days, then advance to 1 full tablet thereafter. We discussed possible side effects of headache, GI upset, drowsiness, and SI/HI. If thoughts of SI/HI develop, we discussed to present to the emergency immediately. Patient verbalized understanding.   Follow up in 6 weeks for re-evaluation.    Follow Up Instructions:  Please call the main office line to schedule a follow up visit for 6 weeks. Call me sooner if you have any problems as discussed.  Start escitalopram (Lexapro) 5 mg tablets for anxiety. Take once every morning.  It was a pleasure meeting you! Mayra ReelKate Melyna Huron, NP-C    I discussed the assessment and treatment plan with the patient. The patient was provided an opportunity to ask questions and all were answered. The patient agreed with the plan and demonstrated an understanding of the instructions.   The patient was advised to call back or seek an in-person evaluation if the symptoms worsen or if the condition fails to improve as anticipated.     Doreene NestKatherine K Tahja Liao, NP    Review of Systems  Cardiovascular: Negative for chest pain.  Neurological: Negative for dizziness.  Psychiatric/Behavioral: Positive for sleep disturbance. The patient is nervous/anxious.        See HPI       Past Medical History:  Diagnosis Date  . Alcohol abuse   . Anxiety   . Asthma    as a child-no inhaler  . Depression   . Migraine      Social History   Socioeconomic History  .  Marital status: Married    Spouse name: [name]  . Number of children: 1  . Years of education: bachelors  . Highest education level: Not on file  Occupational History  . 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 Financial resource strain: Not hard at all  . Food insecurity    Worry: Not on file    Inability: Not on file  . Transportation needs    Medical: Not on file    Non-medical: Not on file  Tobacco Use  . Smoking  status: Never Smoker  . Smokeless tobacco: Never Used  Substance and Sexual Activity  . Alcohol use: Yes    Comment: she either drinks a lot at one time or not at all. when she does drink-she will drink 4 to 5 drinks at one time  . Drug use: No  . Sexual activity: Yes    Partners: Male    Birth control/protection: Surgical  Lifestyle  . Physical activity    Days per week: Not on file    Minutes per session: Not on file  . Stress: Not on file  Relationships  . Social Herbalist on phone: Not on file    Gets together: Not on file    Attends religious service: Not on file    Active member of club or organization: Not on file    Attends meetings of clubs or organizations: Not on file    Relationship status: Not on file  . Intimate partner violence    Fear of current or ex partner: Not on file    Emotionally abused: Not on file    Physically abused: Not on file    Forced sexual activity: Not on file  Other Topics Concern  . Not on file  Social History Narrative   Lives with her husband [name]) , their daughter [name]). His son lives with them every other weekend.   She earned a BFA in sculpture (metal) from [employer].   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 CESAREAN SECTION  07/25/2012   Procedure: CESAREAN SECTION;  Surgeon: Allena Katz, MD;  Location: [facility];  Service: Obstetrics;  Laterality: N/A;  . LAPAROSCOPIC TUBAL LIGATION Bilateral 10/30/2013   Procedure: LAPAROSCOPIC TUBAL LIGATION;  Surgeon: Margarette Asal, MD;  Location: [facility];  Service: Gynecology;  Laterality: Bilateral;  with Filshie Clips    Family History  Problem Relation Age of Onset  . Hypertension Father   . Hyperlipidemia Father   . Heart attack Father 57       x 2. in his 30s  . Depression Mother   .  Pancreatic cancer Maternal Grandmother        Pancreatic  . Diabetes Paternal Grandmother   . Alcohol abuse Maternal Grandfather   . Hyperlipidemia Maternal Grandfather   . Hypertension Maternal Grandfather     No Known Allergies  Current Outpatient Medications on File Prior to Visit  Medication Sig Dispense Refill  . Senna (CORRECTOL HERBAL TEA PO) Take by mouth.    . vitamin B-12 (CYANOCOBALAMIN) 100 MCG tablet Vitamin B-12     No current facility-administered medications on file prior to visit.     LMP 04/14/2019    Objective:  Physical Exam  Constitutional: She is oriented to person, place, and time. She appears well-developed.  Respiratory: Effort normal.  Neurological: She is alert and oriented to person, place, and time.  Psychiatric: She has a normal mood and affect.           Assessment & Plan:

## 2019-05-07 NOTE — Patient Instructions (Signed)
Please call the main office line to schedule a follow up visit for 6 weeks. Call me sooner if you have any problems as discussed.  Start escitalopram (Lexapro) 5 mg tablets for anxiety. Take once every morning.  It was a pleasure meeting you! Allie Bossier, NP-C

## 2019-05-24 ENCOUNTER — Other Ambulatory Visit: Payer: Self-pay | Admitting: Family Medicine

## 2019-05-24 DIAGNOSIS — F411 Generalized anxiety disorder: Secondary | ICD-10-CM

## 2019-05-24 MED ORDER — ESCITALOPRAM OXALATE 10 MG PO TABS: 10.0000 mg | ORAL_TABLET | Freq: Every day | ORAL | 0 refills | Status: DC

## 2019-06-18 ENCOUNTER — Telehealth (INDEPENDENT_AMBULATORY_CARE_PROVIDER_SITE_OTHER): Payer: 59 | Admitting: Primary Care

## 2019-06-18 ENCOUNTER — Encounter: Payer: Self-pay | Admitting: Primary Care

## 2019-06-18 VITALS — Ht 62.25 in | Wt 125.0 lb

## 2019-06-18 DIAGNOSIS — F411 Generalized anxiety disorder: Secondary | ICD-10-CM | POA: Diagnosis not present

## 2019-06-18 MED ORDER — ESCITALOPRAM OXALATE 20 MG PO TABS: 20.0000 mg | ORAL_TABLET | Freq: Every day | ORAL | 0 refills | Status: DC

## 2019-06-18 NOTE — Progress Notes (Signed)
Subjective:    Patient ID: [name] [name], female    DOB: [birth year], 33 y.o.   MRN: [id number]  HPI  Virtual Visit via Video Note  I connected with Konrad Dolores on 06/18/19 at  9:40 AM EDT by a video enabled telemedicine application and verified that I am speaking with the correct person using two identifiers.  Location: Patient: Home Provider: Office   I discussed the limitations of evaluation and management by telemedicine and the availability of in person appointments. The patient expressed understanding and agreed to proceed.  History of Present Illness:  Ms. [name] [name] is a 33 year old female patient of Dr. Elmyra Ricks with a history of OCD and GAD who presents today for follow up of anxiety.  She was last evaluated on 05/07/19 with symptoms of feeling anxious, easily irritable, daily worry, negative thoughts, mind racing thoughts, etc. GAD 7 score of 16 with chronic symptoms so she was initiated on Lexapro 5 mg daily and was asked to follow up today.  She sent a message via my chart on 05/24/19 with reports of some improvement but felt she could use a dose increase so her dose was increased to 10 mg. Since her last visit and dose increase she's doing better. Positive effects of Lexapro include a reduction of her irritability and is able to calm her mind racing/negative thoughts. She does continue to notice difficulty focusing, mind racing thoughts which has improved overall.   She does feel as though she could use another dose adjustment and would like to try 20 mg. She denies SI/HI, GI upset, nausea. She does not wish to pursue therapy at this time.   Observations/Objective:  Alert and oriented. Appears well, not sickly. No distress. Speaking in complete sentences.   Assessment and Plan:  Improved on Lexapro 5 and 10 mg doses. I explained that the goal of anxiety treatment is to improve symptoms by at least 50%, she agrees that she is there.  We will move forward  with a dose increase to 20 mg given residual symptoms that may improve.  Denies SI/HI. She will follow up with her PCP in one month.   Follow Up Instructions:  We've increased your dose of Lexapro to 20 mg. Start by taking 15 mg daily until your bottle is empty. I sent a new prescription for the 20 mg dose.  Schedule a follow up visit with Dr. Selena Batten for one month.  It was a pleasure to see you today! Mayra Reel, NP-C    I discussed the assessment and treatment plan with the patient. The patient was provided an opportunity to ask questions and all were answered. The patient agreed with the plan and demonstrated an understanding of the instructions.   The patient was advised to call back or seek an in-person evaluation if the symptoms worsen or if the condition fails to improve as anticipated.    Doreene Nest, NP    Review of Systems  Gastrointestinal: Negative for abdominal pain and nausea.  Neurological: Negative for dizziness and headaches.  Psychiatric/Behavioral: Negative for suicidal ideas.       See HPI       Past Medical History:  Diagnosis Date  . Alcohol abuse   . Anxiety   . Asthma    as a child-no inhaler  . Depression   . Migraine      Social History   Socioeconomic History  . Marital status: Married    Spouse name: [name]  .  Number of children: 1  . Years of education: bachelors  . Highest education level: Not on file  Occupational History  . 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 Financial resource strain: Not hard at all  . Food insecurity    Worry: Not on file    Inability: Not on file  . Transportation needs    Medical: Not on file    Non-medical: Not on file  Tobacco Use  . Smoking status: Never Smoker  . Smokeless tobacco: Never Used  Substance and Sexual Activity  . Alcohol use: Yes    Comment: she either drinks a lot at one time or not at all. when she does drink-she will  drink 4 to 5 drinks at one time  . Drug use: No  . Sexual activity: Yes    Partners: Male    Birth control/protection: Surgical  Lifestyle  . Physical activity    Days per week: Not on file    Minutes per session: Not on file  . Stress: Not on file  Relationships  . Social Herbalist on phone: Not on file    Gets together: Not on file    Attends religious service: Not on file    Active member of club or organization: Not on file    Attends meetings of clubs or organizations: Not on file    Relationship status: Not on file  . Intimate partner violence    Fear of current or ex partner: Not on file    Emotionally abused: Not on file    Physically abused: Not on file    Forced sexual activity: Not on file  Other Topics Concern  . Not on file  Social History Narrative   Lives with her husband [name] [name]) , their daughter [name] [name]). His son lives with them every other weekend.   She earned a BFA in sculpture (metal) from [employer].   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 CESAREAN SECTION  07/25/2012   Procedure: CESAREAN SECTION;  Surgeon: Allena Katz, MD;  Location: [facility];  Service: Obstetrics;  Laterality: N/A;  . LAPAROSCOPIC TUBAL LIGATION Bilateral 10/30/2013   Procedure: LAPAROSCOPIC TUBAL LIGATION;  Surgeon: Margarette Asal, MD;  Location: [facility];  Service: Gynecology;  Laterality: Bilateral;  with Filshie Clips    Family History  Problem Relation Age of Onset  . Hypertension Father   . Hyperlipidemia Father   . Heart attack Father 19       x 2. in his 57s  . Depression Mother   . Pancreatic cancer Maternal Grandmother        Pancreatic  . Diabetes Paternal Grandmother   . Alcohol abuse Maternal Grandfather   . Hyperlipidemia Maternal Grandfather   . Hypertension Maternal  Grandfather     No Known Allergies  Current Outpatient Medications on File Prior to Visit  Medication Sig Dispense Refill  . Senna (CORRECTOL HERBAL TEA PO) Take by mouth.    . vitamin B-12 (CYANOCOBALAMIN) 100 MCG tablet Vitamin B-12     No current facility-administered medications on file prior to visit.     Ht 5' 2.25" (1.581 m)   Wt 125 lb (56.7 kg)   LMP 06/02/2019  BMI 22.68 kg/m    Objective:   Physical Exam  Constitutional: She is oriented to person, place, and time. She appears well-nourished.  Respiratory: Effort normal.  Neurological: She is alert and oriented to person, place, and time.  Psychiatric: She has a normal mood and affect.           Assessment & Plan:

## 2019-06-18 NOTE — Assessment & Plan Note (Signed)
Improved on Lexapro 5 and 10 mg doses. I explained that the goal of anxiety treatment is to improve symptoms by at least 50%, she agrees that she is there.  We will move forward with a dose increase to 20 mg given residual symptoms that may improve.  Denies SI/HI. She will follow up with her PCP in one month.

## 2019-06-18 NOTE — Patient Instructions (Signed)
We've increased your dose of Lexapro to 20 mg. Start by taking 15 mg daily until your bottle is empty. I sent a new prescription for the 20 mg dose.  Schedule a follow up visit with Dr. Einar Pheasant for one month.  It was a pleasure to see you today! Allie Bossier, NP-C

## 2019-06-21 ENCOUNTER — Other Ambulatory Visit: Payer: Self-pay | Admitting: Family Medicine

## 2019-06-21 DIAGNOSIS — F411 Generalized anxiety disorder: Secondary | ICD-10-CM

## 2019-07-13 ENCOUNTER — Other Ambulatory Visit: Payer: Self-pay | Admitting: Primary Care

## 2019-07-13 DIAGNOSIS — F411 Generalized anxiety disorder: Secondary | ICD-10-CM

## 2019-07-15 NOTE — Telephone Encounter (Signed)
Last prescribed on 06/18/2019 By Allie Bossier . Last appointment on 06/18/2019 . Next future appointment on 07/22/2019 with Dr Einar Pheasant

## 2019-07-22 ENCOUNTER — Encounter: Payer: Self-pay | Admitting: Family Medicine

## 2019-07-22 ENCOUNTER — Ambulatory Visit (INDEPENDENT_AMBULATORY_CARE_PROVIDER_SITE_OTHER): Payer: 59 | Admitting: Family Medicine

## 2019-07-22 DIAGNOSIS — F411 Generalized anxiety disorder: Secondary | ICD-10-CM

## 2019-07-22 MED ORDER — ESCITALOPRAM OXALATE 20 MG PO TABS: 20.0000 mg | ORAL_TABLET | Freq: Every day | ORAL | 3 refills | Status: DC

## 2019-07-22 NOTE — Progress Notes (Signed)
    I connected with [name] [name] on 07/22/19 at 11:00 AM EST by video and verified that I am speaking with the correct person using two identifiers.   I discussed the limitations, risks, security and privacy concerns of performing an evaluation and management service by video and the availability of in person appointments. I also discussed with the patient that there may be a patient responsible charge related to this service. The patient expressed understanding and agreed to proceed.  Patient location: Home Provider Location: Provider home office Participants: Lesleigh Noe and [place] Pu   Subjective:     [name] [name] is a 33 y.o. female presenting for Anxiety (follow up)     HPI  #Anxiety - increased lexapro to 20 mg - before dose increase felt like she would be back and forth - feels level and even now on the the higher dose - biggest trigger was irritability and anger - has tried mindfulness  Review of Systems  06/18/2019: Clinic - virtual - increased lexapro to 20 mg  Social History   Tobacco Use  Smoking Status Never Smoker  Smokeless Tobacco Never Used        Objective:   BP Readings from Last 3 Encounters:  10/30/18 98/72  07/26/16 112/70  01/29/16 116/70   Wt Readings from Last 3 Encounters:  06/18/19 125 lb (56.7 kg)  10/30/18 125 lb 12 oz (57 kg)  07/26/16 126 lb (57.2 kg)   LMP 07/06/2019    Physical Exam Constitutional:      Appearance: Normal appearance. She is not ill-appearing.  HENT:     Head: Normocephalic and atraumatic.     Right Ear: External ear normal.     Left Ear: External ear normal.  Eyes:     Conjunctiva/sclera: Conjunctivae normal.  Pulmonary:     Effort: Pulmonary effort is normal. No respiratory distress.  Neurological:     Mental Status: She is alert. Mental status is at baseline.  Psychiatric:        Mood and Affect: Mood normal.        Behavior: Behavior normal.        Thought Content:  Thought content normal.        Judgment: Judgment normal.     GAD 7 : Generalized Anxiety Score 07/22/2019 05/07/2019  Nervous, Anxious, on Edge 1 3  Control/stop worrying 1 2  Worry too much - different things 1 3  Trouble relaxing 1 1  Restless 1 2  Easily annoyed or irritable 1 3  Afraid - awful might happen 1 2  Total GAD 7 Score 7 16  Anxiety Difficulty Somewhat difficult Somewhat difficult             Assessment & Plan:   Problem List Items Addressed This Visit    None       No follow-ups on file.  Lesleigh Noe, MD

## 2019-07-22 NOTE — Assessment & Plan Note (Signed)
Significantly improved on medication. Discussed continuing for at least a few months and longer if she feels that way. Return in 1 year or sooner.

## 2019-08-03 ENCOUNTER — Encounter: Payer: Self-pay | Admitting: Family Medicine

## 2019-08-06 ENCOUNTER — Encounter: Payer: Self-pay | Admitting: Family Medicine

## 2019-08-06 ENCOUNTER — Ambulatory Visit (INDEPENDENT_AMBULATORY_CARE_PROVIDER_SITE_OTHER): Payer: 59 | Admitting: Family Medicine

## 2019-08-06 DIAGNOSIS — J3489 Other specified disorders of nose and nasal sinuses: Secondary | ICD-10-CM

## 2019-08-06 DIAGNOSIS — M2550 Pain in unspecified joint: Secondary | ICD-10-CM | POA: Diagnosis not present

## 2019-08-06 DIAGNOSIS — Z20822 Contact with and (suspected) exposure to covid-19: Secondary | ICD-10-CM

## 2019-08-06 DIAGNOSIS — R519 Headache, unspecified: Secondary | ICD-10-CM | POA: Diagnosis not present

## 2019-08-06 DIAGNOSIS — R0602 Shortness of breath: Secondary | ICD-10-CM

## 2019-08-06 DIAGNOSIS — R05 Cough: Secondary | ICD-10-CM | POA: Diagnosis not present

## 2019-08-06 DIAGNOSIS — Z20828 Contact with and (suspected) exposure to other viral communicable diseases: Secondary | ICD-10-CM

## 2019-08-06 DIAGNOSIS — R062 Wheezing: Secondary | ICD-10-CM

## 2019-08-06 NOTE — Progress Notes (Signed)
I connected with [name] [name] on 08/06/19 at  2:20 PM EST by video and verified that I am speaking with the correct person using two identifiers.   I discussed the limitations, risks, security and privacy concerns of performing an evaluation and management service by video and the availability of in person appointments. I also discussed with the patient that there may be a patient responsible charge related to this service. The patient expressed understanding and agreed to proceed.  Patient location: Home Provider Location: [place] Participants: Lesleigh Noe and [facility]   Subjective:     [name] [name] is a 33 y.o. female presenting for Cough (Sx started thursday Denies fever ), Nasal Congestion, Fatigue, and Generalized Body Aches     Cough This is a new problem. The current episode started in the past 7 days. The problem has been gradually improving. The cough is productive of sputum. Associated symptoms include headaches, myalgias, rhinorrhea, a sore throat (resolved now), shortness of breath and wheezing. Pertinent negatives include no chills, ear pain or fever. She has tried OTC cough suppressant for the symptoms.   Daughter exposed on 11/23 but found out on Friday and negative for COVID but positive for strep  No direct exposure to the person that her daughter was exposed to - social distancing   Review of Systems  Constitutional: Negative for chills and fever.  HENT: Positive for rhinorrhea and sore throat (resolved now). Negative for ear pain.   Respiratory: Positive for cough, shortness of breath and wheezing.   Musculoskeletal: Positive for myalgias.  Neurological: Positive for headaches.     Social History   Tobacco Use  Smoking Status Never Smoker  Smokeless Tobacco Never Used        Objective:   BP Readings from Last 3 Encounters:  10/30/18 98/72  07/26/16 112/70  01/29/16 116/70   Wt Readings from Last 3  Encounters:  06/18/19 125 lb (56.7 kg)  10/30/18 125 lb 12 oz (57 kg)  07/26/16 126 lb (57.2 kg)    LMP 08/02/2019    Physical Exam Constitutional:      Appearance: Normal appearance. She is not ill-appearing.     Comments: Tired appearing  HENT:     Head: Normocephalic and atraumatic.     Right Ear: External ear normal.     Left Ear: External ear normal.  Eyes:     Conjunctiva/sclera: Conjunctivae normal.  Pulmonary:     Effort: Pulmonary effort is normal. No respiratory distress.  Neurological:     Mental Status: She is alert. Mental status is at baseline.  Psychiatric:        Mood and Affect: Mood normal.        Behavior: Behavior normal.        Thought Content: Thought content normal.        Judgment: Judgment normal.            Assessment & Plan:   Problem List Items Addressed This Visit    None    Visit Diagnoses    Suspected COVID-19 virus infection    -  Primary     Discussed OTC treatment for viral illness Given symptoms possible covid-19 and would recommend being tested ER precautions given  Due to expected wait for testing and that patient was already on home isolation due to exposure she elected to not be tested. She had already notified anyone they saw prior to development of symptoms.  Return if symptoms worsen or fail to improve.  Lesleigh Noe, MD

## 2019-11-12 ENCOUNTER — Encounter: Payer: Self-pay | Admitting: Emergency Medicine

## 2019-11-12 ENCOUNTER — Other Ambulatory Visit: Payer: Self-pay

## 2019-11-12 ENCOUNTER — Ambulatory Visit
Admission: EM | Admit: 2019-11-12 | Discharge: 2019-11-12 | Disposition: A | Discharge status: Home / Self Care | Payer: 59 | Attending: Emergency Medicine | Admitting: Emergency Medicine

## 2019-11-12 DIAGNOSIS — S61012A Laceration without foreign body of left thumb without damage to nail, initial encounter: Secondary | ICD-10-CM | POA: Diagnosis not present

## 2019-11-12 DIAGNOSIS — Z23 Encounter for immunization: Secondary | ICD-10-CM

## 2019-11-12 DIAGNOSIS — W260XXA Contact with knife, initial encounter: Secondary | ICD-10-CM | POA: Diagnosis not present

## 2019-11-12 MED ORDER — TETANUS-DIPHTH-ACELL PERTUSSIS 5-2.5-18.5 LF-MCG/0.5 IM SUSP
0.5000 mL | Freq: Once | INTRAMUSCULAR | Status: AC
  Administered 2019-11-12: 0.5 mL via INTRAMUSCULAR

## 2019-11-12 NOTE — ED Triage Notes (Signed)
Pt has a laceration on her left thumb that occurred about 20 minutes ago. She was cutting vegetables and cut her thumb. Last Tetanus vaccine 04/18/2012.

## 2019-11-12 NOTE — ED Provider Notes (Signed)
[name] [name]    CSN: [id number] Arrival date & time: 11/12/19  1352      History   Chief Complaint Chief Complaint  Patient presents with  . Laceration    left thumb    HPI [name] [name] is a 34 y.o. female.   Patient presents with a laceration on her left thumb.  She cut it with a knife at home while slicing vegetables 20 minutes PTA.  Last tetanus 2013.  She denies numbness or weakness.  Bleeding controlled at home with pressure.    The history is provided by the patient.    Past Medical History:  Diagnosis Date  . Alcohol abuse   . Anxiety   . Asthma    as a child-no inhaler  . Depression   . Migraine     Patient Active Problem List   Diagnosis Date Noted  . Other fatigue 10/30/2018  . Posterior knee pain, left 10/30/2018  . Vegan diet 10/30/2018  . OCD (obsessive compulsive disorder) 10/19/2011  . GAD (generalized anxiety disorder) 10/19/2011    Past Surgical History:  Procedure Laterality Date  . CESAREAN SECTION  07/25/2012   Procedure: CESAREAN SECTION;  Surgeon: [name] Katz, MD;  Location: [facility];  Service: Obstetrics;  Laterality: N/A;  . LAPAROSCOPIC TUBAL LIGATION Bilateral 10/30/2013   Procedure: LAPAROSCOPIC TUBAL LIGATION;  Surgeon: [name] Asal, MD;  Location: [facility];  Service: Gynecology;  Laterality: Bilateral;  with Filshie Clips    OB History    Gravida  1   Para  1   Term  1   Preterm  0   AB  0   Living  1     SAB  0   TAB  0   Ectopic  0   Multiple  0   Live Births  1            Home Medications    Prior to Admission medications   Medication Sig Start Date End Date Taking? Authorizing Provider  escitalopram (LEXAPRO) 20 MG tablet Take 1 tablet (20 mg total) by mouth daily. For anxiety. 07/22/19  Yes [name] Noe, MD  Nutritional Supplements (VITAMIN D BOOSTER PO) Take by mouth.   Yes [provider]  vitamin B-12 (CYANOCOBALAMIN) 100 MCG tablet Vitamin B-12   Yes [provider]  Senna (CORRECTOL HERBAL TEA PO) Take by mouth.    [provider]    Family History Family History  Problem Relation Age of Onset  . Hypertension Father   . Hyperlipidemia Father   . Heart attack Father 98       x 2. in his 87s  . Depression Mother   . Pancreatic cancer Maternal Grandmother        Pancreatic  . Diabetes Paternal Grandmother   . Alcohol abuse Maternal Grandfather   . Hyperlipidemia Maternal Grandfather   . Hypertension Maternal Grandfather     Social History Social History   Tobacco Use  . Smoking status: Never Smoker  . Smokeless tobacco: Never Used  Substance Use Topics  . Alcohol use: Yes    Comment: she either drinks a lot at one time or not at all. when she does drink-she will drink 4 to 5 drinks at one time  . Drug use: No     Allergies   Patient has no known allergies.   Review of Systems Review of Systems  Constitutional: Negative for chills and fever.  HENT: Negative  for ear pain and sore throat.   Eyes: Negative for pain and visual disturbance.  Respiratory: Negative for cough and shortness of breath.   Cardiovascular: Negative for chest pain and palpitations.  Gastrointestinal: Negative for abdominal pain and vomiting.  Genitourinary: Negative for dysuria and hematuria.  Musculoskeletal: Negative for arthralgias and back pain.  Skin: Positive for wound. Negative for color change and rash.  Neurological: Negative for seizures, syncope, weakness and numbness.  All other systems reviewed and are negative.    Physical Exam Triage Vital Signs ED Triage Vitals  Enc Vitals Group     BP      Pulse      Resp      Temp      Temp src      SpO2      Weight      Height      Head Circumference      Peak Flow      Pain Score      Pain Loc      Pain Edu?      Excl. in GC?    No data found.  Updated Vital Signs BP 105/71 (BP Location: Right Arm)   Pulse 78   Temp 98.9 F (37.2 C) (Oral)   Resp 18   Ht  5\' 2"  (1.575 m)   Wt 135 lb (61.2 kg)   LMP 10/28/2019 (Approximate)   SpO2 98%   BMI 24.69 kg/m   Visual Acuity Right Eye Distance:   Left Eye Distance:   Bilateral Distance:    Right Eye Near:   Left Eye Near:    Bilateral Near:     Physical Exam Vitals and nursing note reviewed.  Constitutional:      General: She is not in acute distress.    Appearance: She is well-developed.  HENT:     Head: Normocephalic and atraumatic.     Mouth/Throat:     Mouth: Mucous membranes are moist.     Pharynx: Oropharynx is clear.  Eyes:     Conjunctiva/sclera: Conjunctivae normal.  Cardiovascular:     Rate and Rhythm: Normal rate and regular rhythm.     Heart sounds: No murmur.  Pulmonary:     Effort: Pulmonary effort is normal. No respiratory distress.     Breath sounds: Normal breath sounds.  Abdominal:     Palpations: Abdomen is soft.     Tenderness: There is no abdominal tenderness.  Musculoskeletal:        General: Normal range of motion.       Hands:     Cervical back: Neck supple.  Skin:    General: Skin is warm and dry.     Capillary Refill: Capillary refill takes less than 2 seconds.     Comments: Left thumb: 2 cm flap laceration; no nail involvement; sensation intact; strength 5/5; FROM; brisk cap refill.    Neurological:     Mental Status: She is alert and oriented to person, place, and time.     Sensory: No sensory deficit.     Motor: No weakness.  Psychiatric:        Mood and Affect: Mood normal.        Behavior: Behavior normal.      UC Treatments / Results  Labs (all labs ordered are listed, but only abnormal results are displayed) Labs Reviewed - No data to display  EKG   Radiology No results found.  Procedures Laceration Repair  Date/Time: 11/12/2019 2:30  PM Performed by: [name] Bail, NP Authorized by: [name] Bail, NP   Consent:    Consent obtained:  Verbal   Consent given by:  Patient Anesthesia (see MAR for exact dosages):     Anesthesia method:  Local infiltration   Local anesthetic:  Lidocaine 2% w/o epi Laceration details:    Location:  Finger   Finger location:  L thumb   Length (cm):  2   Depth (mm):  2 Repair type:    Repair type:  Simple Pre-procedure details:    Preparation:  Patient was prepped and draped in usual sterile fashion Exploration:    Hemostasis achieved with:  Direct pressure   Wound exploration: wound explored through full range of motion and entire depth of wound probed and visualized     Contaminated: no   Treatment:    Area cleansed with:  Betadine   Amount of cleaning:  Standard   Irrigation solution:  Sterile water   Irrigation method:  Syringe   Visualized foreign bodies/material removed: no   Skin repair:    Repair method:  Sutures   Suture size:  5-0   Suture material:  Nylon   Number of sutures:  5 Approximation:    Approximation:  Close Post-procedure details:    Dressing:  Non-adherent dressing, sterile dressing and antibiotic ointment   Patient tolerance of procedure:  Tolerated with difficulty Comments:     Patient felt "light-headed" and vomiting x 1 when stuck with needle for numbing.  She improved after cool compress and Sprite.     (including critical care time)  Medications Ordered in UC Medications  Tdap (BOOSTRIX) injection 0.5 mL (0.5 mLs Intramuscular Given 11/12/19 1432)    Initial Impression / Assessment and Plan / UC Course  I have reviewed the triage vital signs and the nursing notes.  Pertinent labs & imaging results that were available during my care of the patient were reviewed by me and considered in my medical decision making (see chart for details).   Left thumb laceration.  Tetanus updated.  5 sutures applied.  Wound care instructions and signs of infection discussed with patient.  Instructed her to return here or follow-up with her PCP for suture removal in 7 to 10 days.  Patient agrees to plan of care.     Final Clinical Impressions(s)  / UC Diagnoses   Final diagnoses:  Laceration of left thumb without foreign body without damage to nail, initial encounter     Discharge Instructions     Your tetanus was updated today.    Your 5 stitches need to be taken out in 7-10 days.  You can come back here for that or go to your doctor's office.    Keep your wound clean and dry.  Wash it gently twice a day with soap and water.    Return here if you see signs of infection, such as increased pain, redness, pus-like drainage, warmth, fever, chills, or other concerning symptoms.      ED Prescriptions    None     I have reviewed the PDMP during this encounter.   [name] Bail, NP 11/12/19 1520

## 2019-11-12 NOTE — Discharge Instructions (Signed)
Your tetanus was updated today.    Your 5 stitches need to be taken out in 7-10 days.  You can come back here for that or go to your doctor's office.    Keep your wound clean and dry.  Wash it gently twice a day with soap and water.    Return here if you see signs of infection, such as increased pain, redness, pus-like drainage, warmth, fever, chills, or other concerning symptoms.

## 2020-06-02 DIAGNOSIS — Z113 Encounter for screening for infections with a predominantly sexual mode of transmission: Secondary | ICD-10-CM | POA: Diagnosis not present

## 2020-06-02 DIAGNOSIS — Z01419 Encounter for gynecological examination (general) (routine) without abnormal findings: Secondary | ICD-10-CM | POA: Diagnosis not present

## 2020-06-02 DIAGNOSIS — R399 Unspecified symptoms and signs involving the genitourinary system: Secondary | ICD-10-CM | POA: Diagnosis not present

## 2020-06-02 DIAGNOSIS — Z309 Encounter for contraceptive management, unspecified: Secondary | ICD-10-CM | POA: Diagnosis not present

## 2020-06-02 DIAGNOSIS — Z6823 Body mass index (BMI) 23.0-23.9, adult: Secondary | ICD-10-CM | POA: Diagnosis not present

## 2020-06-02 DIAGNOSIS — R102 Pelvic and perineal pain: Secondary | ICD-10-CM | POA: Diagnosis not present

## 2020-06-03 DIAGNOSIS — R399 Unspecified symptoms and signs involving the genitourinary system: Secondary | ICD-10-CM | POA: Diagnosis not present

## 2020-06-16 ENCOUNTER — Encounter: Payer: Self-pay | Admitting: Family Medicine

## 2020-06-16 DIAGNOSIS — F411 Generalized anxiety disorder: Secondary | ICD-10-CM

## 2020-06-17 MED ORDER — ESCITALOPRAM OXALATE 10 MG PO TABS: 10.0000 mg | ORAL_TABLET | Freq: Every day | ORAL | 0 refills | Status: DC

## 2020-06-17 NOTE — Addendum Note (Signed)
Addended by: Gweneth Dimitri R on: 06/17/2020 11:54 AM   Modules accepted: Orders

## 2020-07-30 ENCOUNTER — Other Ambulatory Visit: Payer: Self-pay

## 2020-07-30 ENCOUNTER — Encounter: Payer: Self-pay | Admitting: Family Medicine

## 2020-07-30 ENCOUNTER — Ambulatory Visit (INDEPENDENT_AMBULATORY_CARE_PROVIDER_SITE_OTHER): Payer: BC Managed Care – PPO | Admitting: Family Medicine

## 2020-07-30 VITALS — BP 98/70 | HR 86 | Temp 98.3°F | Ht 62.0 in | Wt 125.8 lb

## 2020-07-30 DIAGNOSIS — F411 Generalized anxiety disorder: Secondary | ICD-10-CM | POA: Diagnosis not present

## 2020-07-30 DIAGNOSIS — R5383 Other fatigue: Secondary | ICD-10-CM

## 2020-07-30 LAB — TSH: TSH: 1.48 u[IU]/mL (ref 0.35–4.50)

## 2020-07-30 LAB — COMPREHENSIVE METABOLIC PANEL
ALT: 11 U/L (ref 0–35)
AST: 15 U/L (ref 0–37)
Albumin: 4.2 g/dL (ref 3.5–5.2)
Alkaline Phosphatase: 34 U/L — ABNORMAL LOW (ref 39–117)
BUN: 11 mg/dL (ref 6–23)
CO2: 28 mEq/L (ref 19–32)
Calcium: 9.2 mg/dL (ref 8.4–10.5)
Chloride: 105 mEq/L (ref 96–112)
Creatinine, Ser: 0.74 mg/dL (ref 0.40–1.20)
GFR: 105.7 mL/min (ref >60.00)
Glucose, Bld: 64 mg/dL — ABNORMAL LOW (ref 70–99)
Potassium: 4.9 mEq/L (ref 3.5–5.1)
Sodium: 137 mEq/L (ref 135–145)
Total Bilirubin: 0.7 mg/dL (ref 0.2–1.2)
Total Protein: 6.5 g/dL (ref 6.0–8.3)

## 2020-07-30 LAB — FERRITIN: Ferritin: 25.4 ng/mL (ref 10.0–291.0)

## 2020-07-30 LAB — CBC
HCT: 37.5 % (ref 36.0–46.0)
Hemoglobin: 12.4 g/dL (ref 12.0–15.0)
MCHC: 33.2 g/dL (ref 30.0–36.0)
MCV: 91.7 fl (ref 78.0–100.0)
Platelets: 184 10*3/uL (ref 150.0–400.0)
RBC: 4.09 Mil/uL (ref 3.87–5.11)
RDW: 12.7 % (ref 11.5–15.5)
WBC: 5 10*3/uL (ref 4.0–10.5)

## 2020-07-30 LAB — VITAMIN D 25 HYDROXY (VIT D DEFICIENCY, FRACTURES): VITD: 31.53 ng/mL (ref 30.00–100.00)

## 2020-07-30 NOTE — Assessment & Plan Note (Signed)
Daytime sleepiness - Epworth 7 which is slightly elevated. Slightly enlarged tonsil. Encouraged her to try and sleep 8+ hours a night to see if symptoms improve. Will check blood work to evaluate for other causes.

## 2020-07-30 NOTE — Progress Notes (Signed)
Subjective:     [name] [name] is a 34 y.o. female presenting for Depression, Anxiety ("can't focus at work"), and Fatigue (unusually tired, especially after meals )     HPI  #Anxiety/depression - irritability is improving - mood is more steady - is having a hard time focusing at work - normally will have a scattered thought, but can come back - the other day she was actively trying to focus w/o improvement - not sure if the focus concern as gotten worse with medication - started to feel the seasonal depression with the weather changes - the medication has helped with the depression  Hx of ADHD as child Was on medication - not sure what she was on  Stopped caffeine months ago   Sleep: pretty good sleep, bed 11-11:30, wakes up between 6-6:30, endorses more dreams, on average sleeping   Is getting tired after meals  Appetite is fluctuates - eats all day, or not at all  Epworth sleepiness: 7  Review of Systems   Social History   Tobacco Use  Smoking Status Never Smoker  Smokeless Tobacco Never Used        Objective:    BP Readings from Last 3 Encounters:  07/30/20 98/70  11/12/19 97/68  10/30/18 98/72   Wt Readings from Last 3 Encounters:  07/30/20 125 lb 12 oz (57 kg)  11/12/19 135 lb (61.2 kg)  06/18/19 125 lb (56.7 kg)    BP 98/70   Pulse 86   Temp 98.3 F (36.8 C) (Temporal)   Ht 5\' 2"  (1.575 m)   Wt 125 lb 12 oz (57 kg)   LMP 07/05/2020 (Exact Date)   SpO2 99%   BMI 23.00 kg/m    Physical Exam Constitutional:      General: She is not in acute distress.    Appearance: She is well-developed. She is not diaphoretic.  HENT:     Right Ear: External ear normal.     Left Ear: External ear normal.     Nose: Nose normal.     Mouth/Throat:     Tonsils: No tonsillar exudate. 1+ on the right. 1+ on the left.  Eyes:     Conjunctiva/sclera: Conjunctivae normal.  Cardiovascular:     Rate and Rhythm: Normal rate.  Pulmonary:     Effort:  Pulmonary effort is normal.  Musculoskeletal:     Cervical back: Neck supple.  Skin:    General: Skin is warm and dry.     Capillary Refill: Capillary refill takes less than 2 seconds.  Neurological:     Mental Status: She is alert. Mental status is at baseline.  Psychiatric:        Mood and Affect: Mood normal.        Behavior: Behavior normal.           Assessment & Plan:   Problem List Items Addressed This Visit      Other   GAD (generalized anxiety disorder)    Seasonal depression and anxiety are improved but with some difficulty with focusing. Wonder if sleep is impacting symptoms. Encouraged more sleep and if no improvement could consider trial of alternative SSRI or wellbutrin. Cont lexapro 10 mg for now      Other fatigue - Primary    Daytime sleepiness - Epworth 7 which is slightly elevated. Slightly enlarged tonsil. Encouraged her to try and sleep 8+ hours a night to see if symptoms improve. Will check blood work to evaluate for other  causes.       Relevant Orders   Comprehensive metabolic panel   CBC   Ferritin   Vitamin D, 25-hydroxy   TSH       Return if symptoms worsen or fail to improve.  Lynnda Child, MD  This visit occurred during the SARS-CoV-2 public health emergency.  Safety protocols were in place, including screening questions prior to the visit, additional usage of staff PPE, and extensive cleaning of exam room while observing appropriate contact time as indicated for disinfecting solutions.

## 2020-07-30 NOTE — Assessment & Plan Note (Addendum)
Seasonal depression and anxiety are improved but with some difficulty with focusing. Wonder if sleep is impacting symptoms. Encouraged more sleep and if no improvement could consider trial of alternative SSRI or wellbutrin. Cont lexapro 10 mg for now

## 2020-07-30 NOTE — Patient Instructions (Addendum)
#  Fatigue/sleepiness - try to get more sleep - if still tired with 8 hours of sleep - may need to consider referral  #Focus/adhd - try to get more sleep - if not improving -- can consider alterative like Prozac or Wellbutrin (may not help anxiety, but can help with ADHD)  Mychart in 2 weeks to check in

## 2020-07-31 MED ORDER — ESCITALOPRAM OXALATE 10 MG PO TABS: 10.0000 mg | ORAL_TABLET | Freq: Every day | ORAL | 3 refills | Status: DC

## 2020-07-31 NOTE — Addendum Note (Signed)
Addended by: Gweneth Dimitri R on: 07/31/2020 01:18 PM   Modules accepted: Orders

## 2020-08-03 ENCOUNTER — Encounter: Payer: Self-pay | Admitting: Family Medicine

## 2020-08-03 DIAGNOSIS — F411 Generalized anxiety disorder: Secondary | ICD-10-CM

## 2020-08-03 MED ORDER — ESCITALOPRAM OXALATE 20 MG PO TABS: 20.0000 mg | ORAL_TABLET | Freq: Every day | ORAL | 3 refills | Status: DC

## 2020-09-21 ENCOUNTER — Encounter: Payer: Self-pay | Admitting: Family Medicine

## 2020-09-29 ENCOUNTER — Encounter: Payer: Self-pay | Admitting: Family Medicine

## 2020-09-29 ENCOUNTER — Telehealth (INDEPENDENT_AMBULATORY_CARE_PROVIDER_SITE_OTHER): Payer: BC Managed Care – PPO | Admitting: Family Medicine

## 2020-09-29 VITALS — Wt 130.0 lb

## 2020-09-29 DIAGNOSIS — T43225A Adverse effect of selective serotonin reuptake inhibitors, initial encounter: Secondary | ICD-10-CM | POA: Diagnosis not present

## 2020-09-29 DIAGNOSIS — F411 Generalized anxiety disorder: Secondary | ICD-10-CM

## 2020-09-29 DIAGNOSIS — R6882 Decreased libido: Secondary | ICD-10-CM | POA: Diagnosis not present

## 2020-09-29 NOTE — Patient Instructions (Signed)
#   Low Libido with anxiety - Decrease Lexapro to 10 mg daily  - When you plan to stop completely, would recommend waiting until March or April   - Stopping - decrease to Lexapro 5 mg for 2-3 weeks and then stop, send Mychart message if you need a prescription for this  MyChart message if you think you want to try Wellbutrin   Sleep hygiene checklist: 1. Avoid naps during the day 2. Avoid stimulants such as caffeine and nicotine. Avoid bedtime alcohol (it can speed onset of sleep but the body's metabolism can cause awakenings). At least 2 hours before bedtime 3. All forms of exercise help ensure sound sleep - limit vigorous exercise to morning or late afternoon 4. Avoid food too close to bedtime including chocolate (which contains caffeine) 5. Soak up natural light 6. Establish regular bedtime routine. 7. Associate bed with sleep - avoid TV, computer or phone, reading while in bed. 8. Ensure pleasant, relaxing sleep environment - quiet, dark, cool room.  Good Sleep Hygiene Habits -- Got to bed and wake up within an hour of the same time every day -- Avoid bright screens (from laptop, phone, TV) within at least 30 minutes before bed. The "blue light" supresses the sleep hormone melatonin and the content may stimulate as well -- Maintain a quiet and dark sleep environment (blackout curtains, turn on a fan or white noise to block out disruptive sounds) -- Practicing relaxing activites before bed (taking a shower, reading a book, journaling, meditation app) -- To quiet a busy mind -- consider journaling before bed (jotting down reminders, worry thoughts, as well as positive things like a gratitude list)   Begin a Mindfulness/Meditation practice -- this can take a little as 3 minutes -- You can find resources in books -- Or you can download apps like  ---- Headspace App (which currently has free content called "Weathering the Storm") ---- Calm (which has a few free options)  ---- Insignt  Timer ---- Stop, Breathe & Think  # With each of these Apps - you should decline the "start free trial" offer and as you search through the App should be able to access some of their free content. You can also chose to pay for the content if you find one that works well for you.   # Many of them also offer sleep specific content which may help with insomnia

## 2020-09-29 NOTE — Assessment & Plan Note (Signed)
Worsening sleep and libido on the medication. Discussed option of switching to wellbutrin or prescribing as adjunct or dose decrease. Will decrease lexapro 20 mg >10 mg and reassess. Consider switching to wellbutrin if not improve. Sleep hygiene handout

## 2020-09-29 NOTE — Progress Notes (Signed)
    I connected with EMARY ZALAR on 09/29/20 at  1:00 PM EST by video and verified that I am speaking with the correct person using two identifiers.   I discussed the limitations, risks, security and privacy concerns of performing an evaluation and management service by video and the availability of in person appointments. I also discussed with the patient that there may be a patient responsible charge related to this service. The patient expressed understanding and agreed to proceed.  Patient location: Home Provider Location: [place] Participants: Lynnda Child and Konrad Dolores   Subjective:     MAKAHLA KISER is a 35 y.o. female presenting for Medication Problem (lexapro)     HPI   # anxiety/depression - mood swings are improved - low libido - no sex drive at all - no interest - historically takes medication typically to help with seasonal depression/anxity - hoping to try lowering her dose - thinks she weaned herself took quickly   Review of Systems   Social History   Tobacco Use  Smoking Status Never Smoker  Smokeless Tobacco Never Used        Objective:   BP Readings from Last 3 Encounters:  07/30/20 98/70  11/12/19 97/68  10/30/18 98/72   Wt Readings from Last 3 Encounters:  09/29/20 130 lb (59 kg)  07/30/20 125 lb 12 oz (57 kg)  11/12/19 135 lb (61.2 kg)    Wt 130 lb (59 kg)   LMP 09/27/2020 (Exact Date)   BMI 23.78 kg/m    Physical Exam  Speaking clearly No distress A&O       Assessment & Plan:   Problem List Items Addressed This Visit      Other   GAD (generalized anxiety disorder) - Primary    Worsening sleep and libido on the medication. Discussed option of switching to wellbutrin or prescribing as adjunct or dose decrease. Will decrease lexapro 20 mg >10 mg and reassess. Consider switching to wellbutrin if not improve. Sleep hygiene handout        Interactive audio and video telecommunications  were attempted between this provider and patient, however failed, due to patient having technical difficulties OR patient did not have access to video capability.  We continued and completed visit with audio only.   Start Time: 1:09 End Time: 1:24    Return if symptoms worsen or fail to improve.  Lynnda Child, MD

## 2021-01-28 DIAGNOSIS — Z20822 Contact with and (suspected) exposure to covid-19: Secondary | ICD-10-CM | POA: Diagnosis not present

## 2021-06-16 DIAGNOSIS — Z809 Family history of malignant neoplasm, unspecified: Secondary | ICD-10-CM | POA: Diagnosis not present

## 2021-06-16 DIAGNOSIS — Z808 Family history of malignant neoplasm of other organs or systems: Secondary | ICD-10-CM | POA: Diagnosis not present

## 2021-06-16 DIAGNOSIS — Z01419 Encounter for gynecological examination (general) (routine) without abnormal findings: Secondary | ICD-10-CM | POA: Diagnosis not present

## 2021-06-16 DIAGNOSIS — Z803 Family history of malignant neoplasm of breast: Secondary | ICD-10-CM | POA: Diagnosis not present

## 2021-06-16 DIAGNOSIS — N6310 Unspecified lump in the right breast, unspecified quadrant: Secondary | ICD-10-CM | POA: Diagnosis not present

## 2021-06-16 DIAGNOSIS — Z6822 Body mass index (BMI) 22.0-22.9, adult: Secondary | ICD-10-CM | POA: Diagnosis not present

## 2021-06-17 ENCOUNTER — Other Ambulatory Visit: Payer: Self-pay | Admitting: Obstetrics and Gynecology

## 2021-06-17 DIAGNOSIS — N6310 Unspecified lump in the right breast, unspecified quadrant: Secondary | ICD-10-CM

## 2021-06-29 ENCOUNTER — Encounter: Payer: Self-pay | Admitting: Family Medicine

## 2021-06-30 ENCOUNTER — Other Ambulatory Visit: Payer: Self-pay

## 2021-06-30 ENCOUNTER — Ambulatory Visit (INDEPENDENT_AMBULATORY_CARE_PROVIDER_SITE_OTHER): Payer: BC Managed Care – PPO | Admitting: Family Medicine

## 2021-06-30 VITALS — BP 100/78 | HR 67 | Temp 97.1°F | Ht 62.0 in | Wt 124.4 lb

## 2021-06-30 DIAGNOSIS — T63421A Toxic effect of venom of ants, accidental (unintentional), initial encounter: Secondary | ICD-10-CM | POA: Diagnosis not present

## 2021-06-30 MED ORDER — HYDROXYZINE PAMOATE 25 MG PO CAPS: 25.0000 mg | ORAL_CAPSULE | Freq: Four times a day (QID) | ORAL | 0 refills | Status: DC | PRN

## 2021-06-30 NOTE — Assessment & Plan Note (Signed)
Local allergic reaction. Ibuprofen for swelling. Start 24 hour allergy medication. Declined topical steroid prescription as used her mother's w/o help. Hydroxyzine to see if more effective than benadryl for itch. Discussion regarding steroids - ideally would avoid but symptoms worsening could consider course of oral steroids.

## 2021-06-30 NOTE — Patient Instructions (Addendum)
#  Ant Bite - Ibuprofen 400-800 mg 2-3 times per day (help with swelling and pain and inflammation - 24 hour allergy medication - claritin/zyrtec - store brand  Hydroxyzine - may make you sleepy - do not take with benadryl

## 2021-06-30 NOTE — Progress Notes (Signed)
Subjective:     [name] [name] is a 35 y.o. female presenting for Insect Bite (Ant bite to L foot 4 days ago. Redness, hot and swelling )     HPI  #Fire ant bite - was putting bait to get rid of the fire ant - has not been able to sleep due to itching - swelling on Monday - bite on Sunday - was painful to walk due to swelling - now just itching - treatment - benadryl and ice - whenever it gets warm itching worsens - had a bite in the past - on the hand but that got better on its own - mom is allergic to ant - tried her mom's topical steroids - mom got bit as well    Review of Systems   Social History   Tobacco Use  Smoking Status Never  Smokeless Tobacco Never        Objective:    BP Readings from Last 3 Encounters:  06/30/21 100/78  07/30/20 98/70  11/12/19 97/68   Wt Readings from Last 3 Encounters:  06/30/21 124 lb 6 oz (56.4 kg)  09/29/20 130 lb (59 kg)  07/30/20 125 lb 12 oz (57 kg)    BP 100/78   Pulse 67   Temp (!) 97.1 F (36.2 C) (Temporal)   Ht 5\' 2"  (1.575 m)   Wt 124 lb 6 oz (56.4 kg)   LMP 06/18/2021 (Exact Date)   SpO2 97%   BMI 22.75 kg/m    Physical Exam Constitutional:      General: She is not in acute distress.    Appearance: She is well-developed. She is not diaphoretic.  HENT:     Right Ear: External ear normal.     Left Ear: External ear normal.  Eyes:     Conjunctiva/sclera: Conjunctivae normal.  Cardiovascular:     Rate and Rhythm: Normal rate.  Pulmonary:     Effort: Pulmonary effort is normal.  Musculoskeletal:     Cervical back: Neck supple.  Skin:    General: Skin is warm and dry.     Capillary Refill: Capillary refill takes less than 2 seconds.     Comments: Left medial ankle: insect bite with local erythema and swelling extending along the ankle. TTP over the bite and along the medial ankle  Neurological:     Mental Status: She is alert. Mental status is at baseline.  Psychiatric:        Mood and  Affect: Mood normal.        Behavior: Behavior normal.          Assessment & Plan:   Problem List Items Addressed This Visit       Other   Fire ant bite - Primary    Local allergic reaction. Ibuprofen for swelling. Start 24 hour allergy medication. Declined topical steroid prescription as used her mother's w/o help. Hydroxyzine to see if more effective than benadryl for itch. Discussion regarding steroids - ideally would avoid but symptoms worsening could consider course of oral steroids.       Relevant Medications   hydrOXYzine (VISTARIL) 25 MG capsule   I spent >15 minutes with pt , obtaining history, examining, reviewing chart, documenting encounter and discussing the above plan of care.   Return if symptoms worsen or fail to improve.  08/18/2021, MD  This visit occurred during the SARS-CoV-2 public health emergency.  Safety protocols were in place, including screening questions prior to the visit,  additional usage of staff PPE, and extensive cleaning of exam room while observing appropriate contact time as indicated for disinfecting solutions.

## 2021-07-01 ENCOUNTER — Other Ambulatory Visit: Payer: BC Managed Care – PPO

## 2021-07-19 DIAGNOSIS — Z8742 Personal history of other diseases of the female genital tract: Secondary | ICD-10-CM | POA: Diagnosis not present

## 2021-07-19 DIAGNOSIS — Z3202 Encounter for pregnancy test, result negative: Secondary | ICD-10-CM | POA: Diagnosis not present

## 2021-07-19 DIAGNOSIS — R87810 Cervical high risk human papillomavirus (HPV) DNA test positive: Secondary | ICD-10-CM | POA: Diagnosis not present

## 2021-07-19 DIAGNOSIS — Z803 Family history of malignant neoplasm of breast: Secondary | ICD-10-CM | POA: Diagnosis not present

## 2021-07-19 DIAGNOSIS — N870 Mild cervical dysplasia: Secondary | ICD-10-CM | POA: Diagnosis not present

## 2021-07-19 DIAGNOSIS — Z9189 Other specified personal risk factors, not elsewhere classified: Secondary | ICD-10-CM | POA: Diagnosis not present

## 2021-07-19 DIAGNOSIS — Z809 Family history of malignant neoplasm, unspecified: Secondary | ICD-10-CM | POA: Diagnosis not present

## 2021-07-19 DIAGNOSIS — R87612 Low grade squamous intraepithelial lesion on cytologic smear of cervix (LGSIL): Secondary | ICD-10-CM | POA: Diagnosis not present

## 2021-07-26 ENCOUNTER — Ambulatory Visit
Admission: RE | Admit: 2021-07-26 | Discharge: 2021-07-26 | Disposition: A | Discharge status: Home / Self Care | Payer: BC Managed Care – PPO | Source: Ambulatory Visit | Attending: Obstetrics and Gynecology | Admitting: Obstetrics and Gynecology

## 2021-07-26 ENCOUNTER — Other Ambulatory Visit: Payer: Self-pay | Admitting: Obstetrics and Gynecology

## 2021-07-26 DIAGNOSIS — N6310 Unspecified lump in the right breast, unspecified quadrant: Secondary | ICD-10-CM

## 2021-07-26 DIAGNOSIS — R87612 Low grade squamous intraepithelial lesion on cytologic smear of cervix (LGSIL): Secondary | ICD-10-CM | POA: Diagnosis not present

## 2021-07-26 DIAGNOSIS — R922 Inconclusive mammogram: Secondary | ICD-10-CM | POA: Diagnosis not present

## 2021-07-26 DIAGNOSIS — N644 Mastodynia: Secondary | ICD-10-CM | POA: Diagnosis not present

## 2021-08-04 ENCOUNTER — Encounter: Payer: Self-pay | Admitting: Family Medicine

## 2021-08-11 ENCOUNTER — Encounter: Payer: Self-pay | Admitting: Family

## 2021-08-11 ENCOUNTER — Other Ambulatory Visit: Payer: Self-pay

## 2021-08-11 ENCOUNTER — Telehealth (INDEPENDENT_AMBULATORY_CARE_PROVIDER_SITE_OTHER): Payer: BC Managed Care – PPO | Admitting: Family

## 2021-08-11 VITALS — Ht 62.0 in | Wt 124.0 lb

## 2021-08-11 DIAGNOSIS — F418 Other specified anxiety disorders: Secondary | ICD-10-CM | POA: Diagnosis not present

## 2021-08-11 MED ORDER — ESCITALOPRAM OXALATE 10 MG PO TABS: 10.0000 mg | ORAL_TABLET | Freq: Every day | ORAL | 0 refills | Status: DC

## 2021-08-11 NOTE — Progress Notes (Signed)
MyChart Video Visit    Virtual Visit via Video Note   This visit type was conducted due to national recommendations for restrictions regarding the COVID-19 Pandemic (e.g. social distancing) in an effort to limit this patient's exposure and mitigate transmission in our community. This patient is at least at moderate risk for complications without adequate follow up. This format is felt to be most appropriate for this patient at this time. Physical exam was limited by quality of the video and audio technology used for the visit. CMA was able to get the patient set up on a video visit.  Patient location: Home. Patient and provider in visit Provider location: Office  I discussed the limitations of evaluation and management by telemedicine and the availability of in person appointments. The patient expressed understanding and agreed to proceed.  Visit Date: 08/11/2021  Today's healthcare provider: Mort Sawyers, FNP     Subjective:    Patient ID: [name] [name], female    DOB: 17-Dec-1985, 35 y.o.   MRN: [id number]  Chief Complaint  Patient presents with   Anxiety    Wants to get back on lexapro    Anxiety Patient reports no suicidal ideas.     Pt here with c/o depression and anxiety.   Anxiety/depression: battles almost yearly around this time. Was on lexapro 20 mg in the past, it was d/c because she started to feel better and then symptoms resurfaced. Denies SI and or HI.   Pt not currently seeing a therapist, and she states she doesn't want to for now.   PHQ9 reviewed.  Feeling tired more than usual. Always wanting to lay down, and or go to sleep. Lacking energy. Finds that she isn't wanting to go out and do things she enjoys. Hard to find excitement in things.    Past Medical History:  Diagnosis Date   Alcohol abuse    Anxiety    Asthma    as a child-no inhaler   Depression    Migraine     Past Surgical History:  Procedure Laterality Date   CESAREAN  SECTION  07/25/2012   Procedure: CESAREAN SECTION;  Surgeon: Leslie Andrea, MD;  Location: [facility];  Service: Obstetrics;  Laterality: N/A;   LAPAROSCOPIC TUBAL LIGATION Bilateral 10/30/2013   Procedure: LAPAROSCOPIC TUBAL LIGATION;  Surgeon: Meriel Pica, MD;  Location: [facility];  Service: Gynecology;  Laterality: Bilateral;  with Filshie Clips    Family History  Problem Relation Age of Onset   Hypertension Father    Hyperlipidemia Father    Heart attack Father 50       x 2. 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 when she does drink-she will drink 4 to 5 drinks at one time   Drug use: No   Sexual activity: Yes    Partners: Male    Birth control/protection: Surgical  Other Topics Concern   Not on file  Social History Narrative   Fiance - Cristal Deer - lives parttime, their daughter Tonna Corner).   She earned a BFA in sculpture (metal) from [employer].   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   Enjoys: ride horses, crochet, stain glasses, rock climbing, reading   Exercise: nothing currently,    Diet: plant based   Social Determinants of Corporate investment banker Strain: Not on file  Food Insecurity: Not on file  Transportation Needs: Not on file  Physical Activity: Not  on file  Stress: Not on file  Social Connections: Not on file  Intimate Partner Violence: Not on file    Outpatient Medications Prior to Visit  Medication Sig Dispense Refill   vitamin B-12 (CYANOCOBALAMIN) 100 MCG tablet Vitamin B-12     hydrOXYzine (VISTARIL) 25 MG capsule Take 1 capsule (25 mg total) by mouth every 6 (six) hours as needed for itching. 30 capsule 0   No facility-administered medications prior to visit.    No Known Allergies  Review of Systems  Constitutional:  Positive for fatigue.  Psychiatric/Behavioral:  Positive for agitation and sleep disturbance. Negative for suicidal ideas.       Objective:    Physical Exam Constitutional:      General: She is not in acute distress.    Appearance: Normal appearance. She is normal weight. She is not ill-appearing.  Pulmonary:     Effort: Pulmonary effort is normal.  Neurological:     Mental Status: She is alert.  Psychiatric:        Mood and Affect: Mood normal.        Behavior: Behavior normal.        Thought Content: Thought content normal.        Judgment: Judgment normal.    Ht 5\' 2"  (1.575 m)   Wt 124 lb (56.2 kg)   LMP 07/15/2021   BMI 22.68 kg/m  Wt Readings from Last 3 Encounters:  08/11/21 124 lb (56.2 kg)  06/30/21 124 lb 6 oz (56.4 kg)  09/29/20 130 lb (59 kg)       Assessment & Plan:   Problem List Items Addressed This Visit       Other   Anxiety with depression - Primary    Patient's restarted on Lexapro advised patient for the first week take half tablet of the 10 mg Lexapro patient to increase as long as she is tolerating well at the end of week 1 to 10 mg tablet once daily.  Patient to follow-up within the next month for reevaluation.  Advised patient if any SI and/or HI to please stop the medication immediately and call 911 and/or go to the emergency room.      Relevant Medications   escitalopram (LEXAPRO) 10 MG tablet    I have discontinued 10/01/20. McCormick's hydrOXYzine. I  am also having her start on escitalopram. Additionally, I am having her maintain her vitamin B-12.  Meds ordered this encounter  Medications   escitalopram (LEXAPRO) 10 MG tablet    Sig: Take 1 tablet (10 mg total) by mouth daily.    Dispense:  30 tablet    Refill:  0    Order Specific Question:  Supervising Provider    Answer:   Ermalene Searing, AMY E [[id number]]    I discussed the assessment and treatment plan with the patient. The patient was provided an opportunity to ask questions and all were answered. The patient agreed with the plan and demonstrated an understanding of the instructions.   The patient was advised to call back or seek an in-person evaluation if the symptoms worsen or if the condition fails to improve as anticipated.  I provided 15 minutes of face-to-face time during this encounter.   Mort Sawyers, FNP [facility] [place] [phone] (phone) [phone] (fax)  [facility]

## 2021-08-11 NOTE — Assessment & Plan Note (Signed)
Patient's restarted on Lexapro advised patient for the first week take half tablet of the 10 mg Lexapro patient to increase as long as she is tolerating well at the end of week 1 to 10 mg tablet once daily.  Patient to follow-up within the next month for reevaluation.  Advised patient if any SI and/or HI to please stop the medication immediately and call 911 and/or go to the emergency room.

## 2021-08-11 NOTE — Patient Instructions (Signed)
Start lexapro 10 mg for anxiety and depression. Take 1/2 tablet by mouth once daily for about one week, then increase to 1 full tablet thereafter.   Taking the medicine as directed and not missing any doses is one of the best things you can do to treat your anxiety/depression.  Here are some things to keep in mind:  Side effects (stomach upset, some increased anxiety) may happen before you notice a benefit.  These side effects typically go away over time. Changes to your dose of medicine or a change in medication all together is sometimes necessary Many people will notice an improvement within two weeks but the full effect of the medication can take up to 4-6 weeks Stopping the medication when you start feeling better often results in a return of symptoms. Most people need to be on medication at least 6-12 months If you start having thoughts of hurting yourself or others after starting this medicine, please call me immediately.

## 2021-08-17 ENCOUNTER — Encounter: Payer: Self-pay | Admitting: Family

## 2021-08-17 DIAGNOSIS — F418 Other specified anxiety disorders: Secondary | ICD-10-CM

## 2021-08-30 DIAGNOSIS — Z3202 Encounter for pregnancy test, result negative: Secondary | ICD-10-CM | POA: Diagnosis not present

## 2021-08-30 DIAGNOSIS — R87612 Low grade squamous intraepithelial lesion on cytologic smear of cervix (LGSIL): Secondary | ICD-10-CM | POA: Diagnosis not present

## 2021-08-31 DIAGNOSIS — N870 Mild cervical dysplasia: Secondary | ICD-10-CM | POA: Diagnosis not present

## 2021-09-07 MED ORDER — ESCITALOPRAM OXALATE 20 MG PO TABS: 20.0000 mg | ORAL_TABLET | Freq: Every day | ORAL | 0 refills | Status: DC

## 2021-09-09 ENCOUNTER — Other Ambulatory Visit: Payer: Self-pay | Admitting: Family

## 2021-09-09 DIAGNOSIS — F418 Other specified anxiety disorders: Secondary | ICD-10-CM

## 2021-12-22 ENCOUNTER — Other Ambulatory Visit: Payer: Self-pay | Admitting: Family

## 2021-12-22 MED ORDER — ESCITALOPRAM OXALATE 20 MG PO TABS: 20.0000 mg | ORAL_TABLET | Freq: Every day | ORAL | 0 refills | Status: DC

## 2022-03-19 ENCOUNTER — Other Ambulatory Visit: Payer: Self-pay | Admitting: Family Medicine

## 2022-03-30 ENCOUNTER — Telehealth (INDEPENDENT_AMBULATORY_CARE_PROVIDER_SITE_OTHER): Payer: BC Managed Care – PPO | Admitting: Family Medicine

## 2022-03-30 ENCOUNTER — Telehealth: Payer: Self-pay

## 2022-03-30 ENCOUNTER — Encounter: Payer: Self-pay | Admitting: Family Medicine

## 2022-03-30 VITALS — Ht 62.0 in | Wt 120.0 lb

## 2022-03-30 DIAGNOSIS — F429 Obsessive-compulsive disorder, unspecified: Secondary | ICD-10-CM

## 2022-03-30 DIAGNOSIS — F418 Other specified anxiety disorders: Secondary | ICD-10-CM | POA: Diagnosis not present

## 2022-03-30 MED ORDER — ESCITALOPRAM OXALATE 20 MG PO TABS: 20.0000 mg | ORAL_TABLET | Freq: Every day | ORAL | 1 refills | Status: DC

## 2022-03-30 NOTE — Progress Notes (Signed)
I connected with Konrad Dolores on 03/30/22 at  8:20 AM EDT by video and verified that I am speaking with the correct person using two identifiers.   I discussed the limitations, risks, security and privacy concerns of performing an evaluation and management service by video and the availability of in person appointments. I also discussed with the patient that there may be a patient responsible charge related to this service. The patient expressed understanding and agreed to proceed.  Patient location: Home Provider Location:  [place] Participants: Lynnda Child and Konrad Dolores   Subjective:     [name] [name] is a 36 y.o. female presenting for Medication Refill     HPI  #Anxiety and depression - taking lexapro 20 mg - usually treating for seasonal affective disorder - has continued the medication through the summer - things are going well - still struggling with negative thoughts - has not done therapy  - does have some tools to help with negative thoughts - typically recognizes and tries think 2 good things   Review of Systems   Social History   Tobacco Use  Smoking Status Never  Smokeless Tobacco Never        Objective:   BP Readings from Last 3 Encounters:  06/30/21 100/78  07/30/20 98/70  11/12/19 97/68   Wt Readings from Last 3 Encounters:  03/30/22 120 lb (54.4 kg)  08/11/21 124 lb (56.2 kg)  06/30/21 124 lb 6 oz (56.4 kg)   Ht 5\' 2"  (1.575 m)   Wt 120 lb (54.4 kg)   BMI 21.95 kg/m   Physical Exam Constitutional:      Appearance: Normal appearance. She is not ill-appearing.  HENT:     Head: Normocephalic and atraumatic.     Right Ear: External ear normal.     Left Ear: External ear normal.  Eyes:     Conjunctiva/sclera: Conjunctivae normal.  Pulmonary:     Effort: Pulmonary effort is normal. No respiratory distress.  Neurological:     Mental Status: She is alert. Mental status is at baseline.  Psychiatric:         Mood and Affect: Mood normal.        Behavior: Behavior normal.        Thought Content: Thought content normal.        Judgment: Judgment normal.          03/30/2022    8:17 AM 08/11/2021   11:45 AM 07/30/2020   10:23 AM  Depression screen PHQ 2/9  Decreased Interest 1 3 1   Down, Depressed, Hopeless 1 2 1   PHQ - 2 Score 2 5 2   Altered sleeping 0 2 2  Tired, decreased energy 1 2 3   Change in appetite 0 3 1  Feeling bad or failure about yourself  1 0 0  Trouble concentrating 2 3 3   Moving slowly or fidgety/restless 1 0 0  Suicidal thoughts 0 0 0  PHQ-9 Score 7 15 11   Difficult doing work/chores Somewhat difficult Very difficult Somewhat difficult      03/30/2022    8:19 AM 08/11/2021   11:44 AM 07/30/2020   10:23 AM 07/22/2019   10:43 AM  GAD 7 : Generalized Anxiety Score  Nervous, Anxious, on Edge 1 3 1 1   Control/stop worrying 2 3 1 1   Worry too much - different things 3 3 1 1   Trouble relaxing 0 2 0 1  Restless 1 2 0 1  Easily annoyed or irritable 0 3 1 1   Afraid - awful might happen 1 2 1 1   Total GAD 7 Score 8 18 5 7   Anxiety Difficulty Somewhat difficult Very difficult Somewhat difficult Somewhat difficult          Assessment & Plan:   Problem List Items Addressed This Visit       Other   OCD (obsessive compulsive disorder)    Overall her symptoms are controlled, unfortunately at this time unable to pay for or go to therapy.  Continue interventions that she does at home.  Continue Lexapro 20 mg      Relevant Medications   escitalopram (LEXAPRO) 20 MG tablet   Anxiety with depression - Primary    Significant improvement since restarting medication, patient feels she is doing better with continuous Lexapro.  Follow-up in 6 months      Relevant Medications   escitalopram (LEXAPRO) 20 MG tablet     Return in about 6 months (around 09/30/2022).  , MD

## 2022-03-30 NOTE — Assessment & Plan Note (Signed)
Significant improvement since restarting medication, patient feels she is doing better with continuous Lexapro.  Follow-up in 6 months

## 2022-03-30 NOTE — Telephone Encounter (Signed)
[facility] Night - Client Nonclinical Telephone Record  [facility] Client [facility] Night - Client Client Site [facility] - Night Provider Gweneth Dimitri- MD Contact Type Call Who Is Calling Patient / Member / Family / Caregiver Caller Name Amberlea Spagnuolo Caller Phone Number [phone] Patient Name [name] [name] Patient DOB 1985-09-19 Call Type Message Only Information Provided Reason for Call Request to Reschedule Office Appointment Initial Comment Caller states she wants to change her appointment for a phone visit. Patient request to speak to RN No Disp. Time Disposition Final User 03/30/2022 6:35:34 AM General Information Provided Yes Lonna Cobb Call Closed By: Lonna Cobb Transaction Date/Time: 03/30/2022 6:33:11 AM (ET   Per chart review pt has already had video visit this morning. Nothing further needed at this time.

## 2022-03-30 NOTE — Assessment & Plan Note (Signed)
Overall her symptoms are controlled, unfortunately at this time unable to pay for or go to therapy.  Continue interventions that she does at home.  Continue Lexapro 20 mg

## 2022-04-18 ENCOUNTER — Other Ambulatory Visit: Payer: Self-pay | Admitting: Family Medicine

## 2022-04-18 DIAGNOSIS — F429 Obsessive-compulsive disorder, unspecified: Secondary | ICD-10-CM

## 2022-04-18 DIAGNOSIS — F418 Other specified anxiety disorders: Secondary | ICD-10-CM

## 2022-05-02 ENCOUNTER — Ambulatory Visit
Admission: RE | Admit: 2022-05-02 | Discharge: 2022-05-02 | Disposition: A | Discharge status: Home / Self Care | Payer: BC Managed Care – PPO | Source: Ambulatory Visit | Attending: Obstetrics and Gynecology | Admitting: Obstetrics and Gynecology

## 2022-05-02 ENCOUNTER — Other Ambulatory Visit: Payer: Self-pay | Admitting: Obstetrics and Gynecology

## 2022-05-02 DIAGNOSIS — N6310 Unspecified lump in the right breast, unspecified quadrant: Secondary | ICD-10-CM

## 2022-05-02 DIAGNOSIS — N6489 Other specified disorders of breast: Secondary | ICD-10-CM | POA: Diagnosis not present

## 2022-11-03 ENCOUNTER — Ambulatory Visit
Admission: RE | Admit: 2022-11-03 | Discharge: 2022-11-03 | Disposition: A | Discharge status: Home / Self Care | Payer: BC Managed Care – PPO | Source: Ambulatory Visit | Attending: Obstetrics and Gynecology | Admitting: Obstetrics and Gynecology

## 2022-11-03 DIAGNOSIS — R922 Inconclusive mammogram: Secondary | ICD-10-CM | POA: Diagnosis not present

## 2022-11-03 DIAGNOSIS — N6310 Unspecified lump in the right breast, unspecified quadrant: Secondary | ICD-10-CM

## 2022-11-14 ENCOUNTER — Telehealth: Payer: Self-pay | Admitting: Family Medicine

## 2022-11-14 NOTE — Telephone Encounter (Signed)
Prescription Request  11/14/2022  LOV: Visit date not found  What is the name of the medication or equipment? escitalopram (LEXAPRO) 20 MG tablet   Have you contacted your pharmacy to request a refill? Yes  Pharmacy instructed pt to reach out to office for refill  Which pharmacy would you like this sent to?  [id number]: [phone]: [phone]    Patient notified that their request is being sent to the clinical staff for review and that they should receive a response within 2 business days.   Please advise at Mobile [phone](mobile)  TOC scheduled with [facility] on 01/05/23

## 2022-11-15 ENCOUNTER — Other Ambulatory Visit: Payer: Self-pay

## 2022-11-15 DIAGNOSIS — F418 Other specified anxiety disorders: Secondary | ICD-10-CM

## 2022-11-15 DIAGNOSIS — F429 Obsessive-compulsive disorder, unspecified: Secondary | ICD-10-CM

## 2022-11-15 NOTE — Telephone Encounter (Signed)
Patient called in to follow up on refill request. Informed her of timeframe.

## 2022-11-15 NOTE — Telephone Encounter (Signed)
RX sent to provider

## 2022-11-15 NOTE — Telephone Encounter (Signed)
Last visit 03/30/2022 Next TOC with [facility] 01/05/2023

## 2022-11-16 MED ORDER — ESCITALOPRAM OXALATE 20 MG PO TABS: 20.0000 mg | ORAL_TABLET | Freq: Every day | ORAL | 0 refills | Status: AC

## 2022-11-16 NOTE — Telephone Encounter (Signed)
Refill sent in

## 2023-01-05 ENCOUNTER — Encounter: Payer: BC Managed Care – PPO | Admitting: Nurse Practitioner

## 2023-02-11 ENCOUNTER — Other Ambulatory Visit: Payer: Self-pay | Admitting: Nurse Practitioner

## 2023-02-11 DIAGNOSIS — F418 Other specified anxiety disorders: Secondary | ICD-10-CM

## 2023-02-11 DIAGNOSIS — F429 Obsessive-compulsive disorder, unspecified: Secondary | ICD-10-CM

## 2023-02-13 NOTE — Telephone Encounter (Signed)
Patient has not scheduled for a TOC please have her schedule with one or [place]

## 2023-02-14 NOTE — Telephone Encounter (Signed)
Spoke to pt, pt stated she has stopped the medication & has moved into [place], she plans to find a provider closer to her.

## 2023-05-23 DIAGNOSIS — M25571 Pain in right ankle and joints of right foot: Secondary | ICD-10-CM | POA: Diagnosis not present

## 2023-05-31 DIAGNOSIS — Z7689 Persons encountering health services in other specified circumstances: Secondary | ICD-10-CM | POA: Diagnosis not present

## 2023-05-31 DIAGNOSIS — F418 Other specified anxiety disorders: Secondary | ICD-10-CM | POA: Diagnosis not present

## 2023-06-28 IMAGING — US US BREAST*R* LIMITED INC AXILLA
1 series · 2 of 2 positions shown · non-contrast
Comparison: None.

CLINICAL DATA: 35-year-old female presenting with a new lump and
associated tenderness in the upper outer right breast for
approximately 6 months.

EXAM:
DIGITAL DIAGNOSTIC BILATERAL MAMMOGRAM WITH TOMOSYNTHESIS AND CAD;
ULTRASOUND RIGHT BREAST LIMITED
TECHNIQUE: Bilateral digital diagnostic mammography and breast tomosynthesis
was performed. The images were evaluated with computer-aided
detection.; Targeted ultrasound examination of the right breast was
performed

[Series 1: us breast*right* limited inc axilla · 0.06mm/px · 2 of 2 slices shown]
[im 1/2]
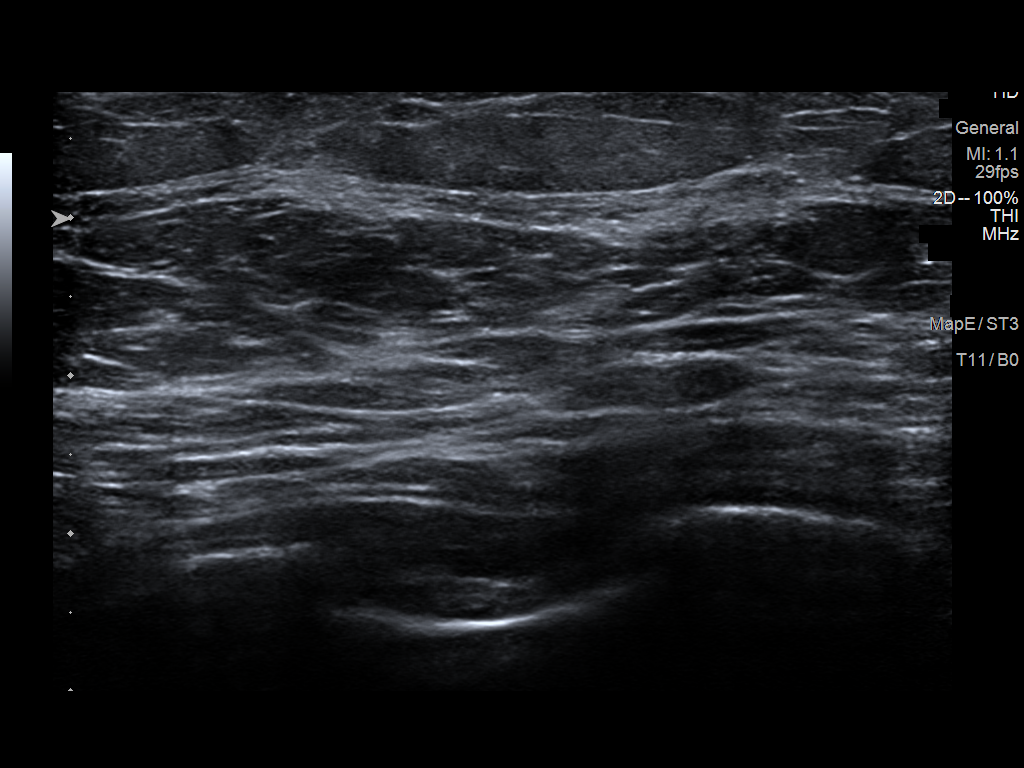
[im 2/2]
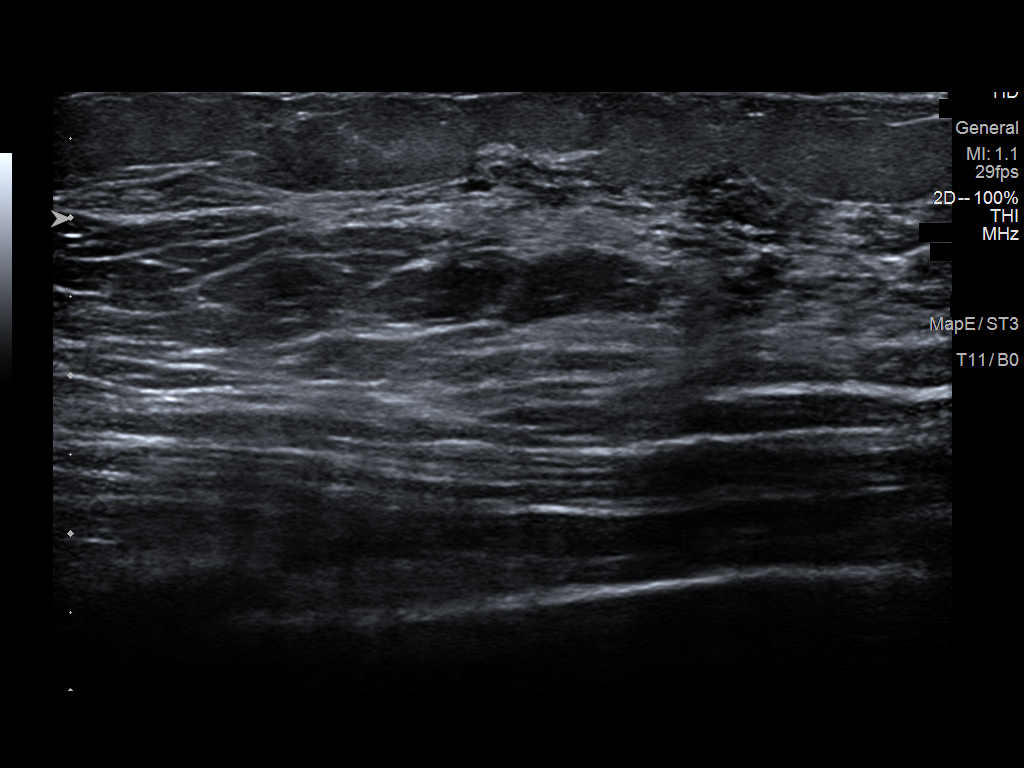

[2 of 2 positions shown; findings below may reference images not displayed]

ACR Breast Density Category c: The breast tissue is heterogeneously
dense, which may obscure small masses.
FINDINGS: Mammogram:

Right breast: A skin BB marks the palpable site of concern in the
upper outer right breast. A spot tangential view of this area was
performed in addition to standard views. There is an asymmetry deep
to the which likely represents normal fibroglandular tissue. There
are no additional findings elsewhere in the right breast.

Left breast: No suspicious mass, distortion, or microcalcifications
are identified to suggest presence of malignancy.

On physical exam at the site of concern in the outer upper outer
right breast I do not feel a discrete mass or focal area of
thickening.

Ultrasound:

Targeted ultrasound is performed at the palpable site in the right
breast at 11 o'clock 3 cm from nipple demonstrating no cystic or
solid mass.
IMPRESSION: 1. Probably benign asymmetry without sonographic correlate in the
upper outer right breast. This likely represents normal dense
tissue.

2.  No mammographic evidence of malignancy in the left breast.

RECOMMENDATION:
Diagnostic right breast mammogram in 6 months.

I have discussed the findings and recommendations with the patient
who agrees to short-term follow-up. If applicable, a reminder letter
will be sent to the patient regarding the next appointment.

BI-RADS CATEGORY  3: Probably benign.

## 2023-07-14 DIAGNOSIS — J45909 Unspecified asthma, uncomplicated: Secondary | ICD-10-CM | POA: Diagnosis not present

## 2023-07-14 DIAGNOSIS — S99912A Unspecified injury of left ankle, initial encounter: Secondary | ICD-10-CM | POA: Diagnosis not present

## 2023-07-14 DIAGNOSIS — F429 Obsessive-compulsive disorder, unspecified: Secondary | ICD-10-CM | POA: Diagnosis not present

## 2023-07-14 DIAGNOSIS — F418 Other specified anxiety disorders: Secondary | ICD-10-CM | POA: Diagnosis not present
